# Patient Record
Sex: Female | Born: 1950 | Race: White | Hispanic: No | Smoking: Never smoker
Health system: Southern US, Community
[De-identification: ages and names within clinical notes are randomized; demographics above are authoritative.]

## PROBLEM LIST (undated history)

## (undated) DIAGNOSIS — K259 Gastric ulcer, unspecified as acute or chronic, without hemorrhage or perforation: Secondary | ICD-10-CM

## (undated) DIAGNOSIS — C801 Malignant (primary) neoplasm, unspecified: Secondary | ICD-10-CM

## (undated) HISTORY — DX: Gastric ulcer, unspecified as acute or chronic, without hemorrhage or perforation: K25.9

## (undated) HISTORY — DX: Malignant (primary) neoplasm, unspecified: C80.1

---

## 1979-11-19 HISTORY — PX: ABDOMINAL HYSTERECTOMY: SHX81

## 2005-12-01 ENCOUNTER — Emergency Department: Payer: Self-pay | Admitting: Emergency Medicine

## 2008-11-18 DIAGNOSIS — C801 Malignant (primary) neoplasm, unspecified: Secondary | ICD-10-CM

## 2008-11-18 HISTORY — PX: LAPAROSCOPY: SHX197

## 2008-11-18 HISTORY — PX: PORT A CATH REVISION: SHX6033

## 2008-11-18 HISTORY — DX: Malignant (primary) neoplasm, unspecified: C80.1

## 2008-11-18 HISTORY — PX: UPPER GI ENDOSCOPY: SHX6162

## 2008-11-18 HISTORY — PX: OVARY SURGERY: SHX727

## 2008-11-18 HISTORY — PX: COLONOSCOPY: SHX174

## 2008-12-04 ENCOUNTER — Inpatient Hospital Stay: Payer: Self-pay | Admitting: Internal Medicine

## 2008-12-19 ENCOUNTER — Ambulatory Visit: Payer: Self-pay | Admitting: Gynecologic Oncology

## 2008-12-19 ENCOUNTER — Ambulatory Visit: Payer: Self-pay | Admitting: Oncology

## 2008-12-25 ENCOUNTER — Inpatient Hospital Stay: Payer: Self-pay | Admitting: Oncology

## 2009-01-03 ENCOUNTER — Ambulatory Visit: Payer: Self-pay | Admitting: Gynecologic Oncology

## 2009-01-16 ENCOUNTER — Ambulatory Visit: Payer: Self-pay | Admitting: Gynecologic Oncology

## 2009-02-06 ENCOUNTER — Ambulatory Visit: Payer: Self-pay | Admitting: General Surgery

## 2009-02-16 ENCOUNTER — Ambulatory Visit: Payer: Self-pay | Admitting: Gynecologic Oncology

## 2009-03-09 ENCOUNTER — Ambulatory Visit: Payer: Self-pay | Admitting: Gynecologic Oncology

## 2009-03-14 ENCOUNTER — Inpatient Hospital Stay: Payer: Self-pay | Admitting: Oncology

## 2009-03-18 ENCOUNTER — Ambulatory Visit: Payer: Self-pay | Admitting: Gynecologic Oncology

## 2009-03-20 ENCOUNTER — Ambulatory Visit: Payer: Self-pay | Admitting: Gynecologic Oncology

## 2009-04-18 ENCOUNTER — Ambulatory Visit: Payer: Self-pay | Admitting: Gynecologic Oncology

## 2009-05-18 ENCOUNTER — Ambulatory Visit: Payer: Self-pay | Admitting: Gynecologic Oncology

## 2009-06-11 ENCOUNTER — Emergency Department: Payer: Self-pay | Admitting: Emergency Medicine

## 2009-06-18 ENCOUNTER — Ambulatory Visit: Payer: Self-pay | Admitting: Gynecologic Oncology

## 2009-07-19 ENCOUNTER — Ambulatory Visit: Payer: Self-pay | Admitting: Gynecologic Oncology

## 2009-07-28 ENCOUNTER — Ambulatory Visit: Payer: Self-pay | Admitting: Gynecologic Oncology

## 2009-08-15 ENCOUNTER — Inpatient Hospital Stay: Payer: Self-pay | Admitting: Internal Medicine

## 2009-08-18 ENCOUNTER — Ambulatory Visit: Payer: Self-pay | Admitting: Gynecologic Oncology

## 2009-09-18 ENCOUNTER — Ambulatory Visit: Payer: Self-pay | Admitting: Gynecologic Oncology

## 2009-10-18 ENCOUNTER — Ambulatory Visit: Payer: Self-pay | Admitting: Gynecologic Oncology

## 2009-11-18 ENCOUNTER — Ambulatory Visit: Payer: Self-pay | Admitting: Gynecologic Oncology

## 2009-12-19 ENCOUNTER — Ambulatory Visit: Payer: Self-pay | Admitting: Gynecologic Oncology

## 2010-01-16 ENCOUNTER — Ambulatory Visit: Payer: Self-pay | Admitting: Gynecologic Oncology

## 2010-02-16 ENCOUNTER — Ambulatory Visit: Payer: Self-pay | Admitting: Gynecologic Oncology

## 2010-03-18 ENCOUNTER — Ambulatory Visit: Payer: Self-pay | Admitting: Gynecologic Oncology

## 2010-04-18 ENCOUNTER — Ambulatory Visit: Payer: Self-pay | Admitting: Gynecologic Oncology

## 2010-05-18 ENCOUNTER — Ambulatory Visit: Payer: Self-pay | Admitting: Gynecologic Oncology

## 2010-06-18 ENCOUNTER — Ambulatory Visit: Payer: Self-pay | Admitting: Gynecologic Oncology

## 2010-06-28 ENCOUNTER — Inpatient Hospital Stay: Payer: Self-pay | Admitting: Oncology

## 2010-07-19 ENCOUNTER — Ambulatory Visit: Payer: Self-pay | Admitting: Gynecologic Oncology

## 2010-08-18 ENCOUNTER — Ambulatory Visit: Payer: Self-pay | Admitting: Gynecologic Oncology

## 2010-08-29 LAB — CA 125: CA 125: 97.3 U/mL — ABNORMAL HIGH (ref 0.0–34.0)

## 2010-09-18 ENCOUNTER — Ambulatory Visit: Payer: Self-pay | Admitting: Gynecologic Oncology

## 2010-10-18 ENCOUNTER — Ambulatory Visit: Payer: Self-pay | Admitting: Gynecologic Oncology

## 2010-10-21 LAB — CA 125: CA 125: 303.1 U/mL — ABNORMAL HIGH (ref 0.0–34.0)

## 2010-10-24 ENCOUNTER — Inpatient Hospital Stay: Payer: Self-pay | Admitting: Internal Medicine

## 2010-10-28 ENCOUNTER — Emergency Department: Payer: Self-pay | Admitting: Emergency Medicine

## 2010-11-15 LAB — CA 125: CA 125: 172.1 U/mL — ABNORMAL HIGH (ref 0.0–34.0)

## 2010-11-18 ENCOUNTER — Ambulatory Visit: Payer: Self-pay | Admitting: Gynecologic Oncology

## 2010-12-05 LAB — CA 125: CA 125: 72.6 U/mL — ABNORMAL HIGH (ref 0.0–34.0)

## 2010-12-18 LAB — CA 125: CA 125: 45.9 U/mL — ABNORMAL HIGH (ref 0.0–34.0)

## 2010-12-19 ENCOUNTER — Ambulatory Visit: Payer: Self-pay | Admitting: Gynecologic Oncology

## 2011-01-17 ENCOUNTER — Ambulatory Visit: Payer: Self-pay | Admitting: Gynecologic Oncology

## 2011-02-02 ENCOUNTER — Emergency Department: Payer: Self-pay | Admitting: Unknown Physician Specialty

## 2011-02-17 ENCOUNTER — Ambulatory Visit: Payer: Self-pay | Admitting: Gynecologic Oncology

## 2011-02-19 LAB — CA 125: CA 125: 52.8 U/mL — ABNORMAL HIGH (ref 0.0–34.0)

## 2011-03-19 ENCOUNTER — Ambulatory Visit: Payer: Self-pay | Admitting: Gynecologic Oncology

## 2011-04-19 ENCOUNTER — Ambulatory Visit: Payer: Self-pay | Admitting: Gynecologic Oncology

## 2011-05-19 ENCOUNTER — Ambulatory Visit: Payer: Self-pay | Admitting: Gynecologic Oncology

## 2011-06-19 ENCOUNTER — Ambulatory Visit: Payer: Self-pay | Admitting: Gynecologic Oncology

## 2011-07-12 LAB — CA 125: CA 125: 58 U/mL — ABNORMAL HIGH (ref 0.0–34.0)

## 2011-07-20 ENCOUNTER — Ambulatory Visit: Payer: Self-pay | Admitting: Gynecologic Oncology

## 2011-08-19 ENCOUNTER — Ambulatory Visit: Payer: Self-pay | Admitting: Gynecologic Oncology

## 2011-09-03 LAB — CA 125: CA 125: 65.5 U/mL — ABNORMAL HIGH (ref 0.0–34.0)

## 2011-09-19 ENCOUNTER — Ambulatory Visit: Payer: Self-pay | Admitting: Gynecologic Oncology

## 2011-10-19 ENCOUNTER — Ambulatory Visit: Payer: Self-pay | Admitting: Gynecologic Oncology

## 2011-11-19 ENCOUNTER — Ambulatory Visit: Payer: Self-pay | Admitting: Gynecologic Oncology

## 2011-11-19 HISTORY — PX: ABDOMINAL SURGERY: SHX537

## 2011-11-19 LAB — CA 125: CA 125: 58.1 U/mL — ABNORMAL HIGH (ref 0.0–34.0)

## 2011-11-25 LAB — CBC CANCER CENTER
Eosinophil: 1 %
HCT: 28.9 % — ABNORMAL LOW (ref 35.0–47.0)
HGB: 9.1 g/dL — ABNORMAL LOW (ref 12.0–16.0)
Lymphocytes: 7 %
MCH: 24.2 pg — ABNORMAL LOW (ref 26.0–34.0)
MCV: 77 fL — ABNORMAL LOW (ref 80–100)
Monocytes: 2 %
Platelet: 253 x10 3/mm (ref 150–440)
RBC: 3.76 10*6/uL — ABNORMAL LOW (ref 3.80–5.20)
WBC: 27.2 x10 3/mm — ABNORMAL HIGH (ref 3.6–11.0)

## 2011-12-02 LAB — CBC CANCER CENTER
Eosinophil: 1 %
HCT: 28.8 % — ABNORMAL LOW (ref 35.0–47.0)
HGB: 9.1 g/dL — ABNORMAL LOW (ref 12.0–16.0)
Lymphocytes: 6 %
MCV: 76 fL — ABNORMAL LOW (ref 80–100)
Platelet: 184 x10 3/mm (ref 150–440)
RBC: 3.79 10*6/uL — ABNORMAL LOW (ref 3.80–5.20)
RDW: 22.6 % — ABNORMAL HIGH (ref 11.5–14.5)
WBC: 9.9 x10 3/mm (ref 3.6–11.0)

## 2011-12-04 ENCOUNTER — Ambulatory Visit: Payer: Self-pay | Admitting: General Surgery

## 2011-12-09 LAB — COMPREHENSIVE METABOLIC PANEL
Albumin: 3.5 g/dL (ref 3.4–5.0)
Alkaline Phosphatase: 103 U/L (ref 50–136)
Anion Gap: 6 — ABNORMAL LOW (ref 7–16)
Calcium, Total: 8.7 mg/dL (ref 8.5–10.1)
Co2: 33 mmol/L — ABNORMAL HIGH (ref 21–32)
Creatinine: 1.04 mg/dL (ref 0.60–1.30)
EGFR (African American): >60
Glucose: 105 mg/dL — ABNORMAL HIGH (ref 65–99)
SGOT(AST): 12 U/L — ABNORMAL LOW (ref 15–37)
SGPT (ALT): 20 U/L
Sodium: 138 mmol/L (ref 136–145)
Total Protein: 7.2 g/dL (ref 6.4–8.2)

## 2011-12-09 LAB — CBC CANCER CENTER
Basophil #: 0 x10 3/mm (ref 0.0–0.1)
Basophil %: 0 %
Eosinophil %: 3.4 %
Eosinophil: 1 %
HCT: 29.1 % — ABNORMAL LOW (ref 35.0–47.0)
HGB: 9.1 g/dL — ABNORMAL LOW (ref 12.0–16.0)
Lymphocyte #: 0.9 x10 3/mm — ABNORMAL LOW (ref 1.0–3.6)
MCH: 23.7 pg — ABNORMAL LOW (ref 26.0–34.0)
MCV: 76 fL — ABNORMAL LOW (ref 80–100)
Monocyte %: 5.4 %
Monocytes: 6 %
Neutrophil #: 3.2 x10 3/mm (ref 1.4–6.5)
RDW: 22.7 % — ABNORMAL HIGH (ref 11.5–14.5)
WBC: 4.6 x10 3/mm (ref 3.6–11.0)

## 2011-12-11 ENCOUNTER — Ambulatory Visit: Payer: Self-pay | Admitting: General Surgery

## 2011-12-13 LAB — PATHOLOGY REPORT

## 2011-12-20 ENCOUNTER — Ambulatory Visit: Payer: Self-pay | Admitting: Gynecologic Oncology

## 2011-12-23 LAB — CBC CANCER CENTER
Basophil #: 0 x10 3/mm (ref 0.0–0.1)
Basophil %: 0 %
Eosinophil #: 0.1 x10 3/mm (ref 0.0–0.7)
HCT: 26.8 % — ABNORMAL LOW (ref 35.0–47.0)
HGB: 8.3 g/dL — ABNORMAL LOW (ref 12.0–16.0)
Lymphocyte %: 16.7 %
MCH: 23.3 pg — ABNORMAL LOW (ref 26.0–34.0)
MCHC: 31.1 g/dL — ABNORMAL LOW (ref 32.0–36.0)
Monocyte #: 0.3 x10 3/mm (ref 0.0–0.7)
Neutrophil #: 3 x10 3/mm (ref 1.4–6.5)
Neutrophil %: 73.2 %
Platelet: 274 x10 3/mm (ref 150–440)
RDW: 20.9 % — ABNORMAL HIGH (ref 11.5–14.5)
Segmented Neutrophils: 74 %
WBC: 4.1 x10 3/mm (ref 3.6–11.0)

## 2011-12-23 LAB — COMPREHENSIVE METABOLIC PANEL
Alkaline Phosphatase: 71 U/L (ref 50–136)
BUN: 17 mg/dL (ref 7–18)
Bilirubin,Total: 0.1 mg/dL — ABNORMAL LOW (ref 0.2–1.0)
Chloride: 100 mmol/L (ref 98–107)
Creatinine: 1.11 mg/dL (ref 0.60–1.30)
EGFR (African American): >60
SGOT(AST): 19 U/L (ref 15–37)
SGPT (ALT): 23 U/L
Total Protein: 6.6 g/dL (ref 6.4–8.2)

## 2011-12-30 LAB — CBC CANCER CENTER
Bands: 6 %
Eosinophil: 2 %
Lymphocytes: 19 %
MCHC: 31.2 g/dL — ABNORMAL LOW (ref 32.0–36.0)
MCV: 75 fL — ABNORMAL LOW (ref 80–100)
Monocytes: 7 %
Platelet: 139 x10 3/mm — ABNORMAL LOW (ref 150–440)
RBC: 3.61 10*6/uL — ABNORMAL LOW (ref 3.80–5.20)
Segmented Neutrophils: 66 %
WBC: 10.1 x10 3/mm (ref 3.6–11.0)

## 2012-01-06 LAB — CBC CANCER CENTER
Bands: 15 %
Eosinophil: 1 %
HCT: 29.5 % — ABNORMAL LOW (ref 35.0–47.0)
Lymphocytes: 8 %
MCH: 23.1 pg — ABNORMAL LOW (ref 26.0–34.0)
MCHC: 31 g/dL — ABNORMAL LOW (ref 32.0–36.0)
MCV: 74 fL — ABNORMAL LOW (ref 80–100)
Monocytes: 3 %
Platelet: 229 x10 3/mm (ref 150–440)
RBC: 3.97 10*6/uL (ref 3.80–5.20)
Segmented Neutrophils: 73 %

## 2012-01-13 LAB — CBC CANCER CENTER
Basophil #: 0 x10 3/mm (ref 0.0–0.1)
Basophil %: 0 %
Eosinophil: 4 %
HCT: 28.4 % — ABNORMAL LOW (ref 35.0–47.0)
HGB: 8.7 g/dL — ABNORMAL LOW (ref 12.0–16.0)
Lymphocyte #: 0.7 x10 3/mm — ABNORMAL LOW (ref 1.0–3.6)
MCH: 22.6 pg — ABNORMAL LOW (ref 26.0–34.0)
MCHC: 30.6 g/dL — ABNORMAL LOW (ref 32.0–36.0)
MCV: 74 fL — ABNORMAL LOW (ref 80–100)
Monocyte #: 0.2 x10 3/mm (ref 0.0–0.7)
Monocyte %: 7.9 %
Monocytes: 9 %
Neutrophil #: 1.6 x10 3/mm (ref 1.4–6.5)
Platelet: 307 x10 3/mm (ref 150–440)
RDW: 20.8 % — ABNORMAL HIGH (ref 11.5–14.5)

## 2012-01-14 LAB — CA 125: CA 125: 65.8 U/mL — ABNORMAL HIGH (ref 0.0–34.0)

## 2012-01-17 ENCOUNTER — Ambulatory Visit: Payer: Self-pay | Admitting: Gynecologic Oncology

## 2012-01-20 LAB — COMPREHENSIVE METABOLIC PANEL
Alkaline Phosphatase: 65 U/L (ref 50–136)
Anion Gap: 8 (ref 7–16)
BUN: 15 mg/dL (ref 7–18)
Calcium, Total: 8.4 mg/dL — ABNORMAL LOW (ref 8.5–10.1)
Chloride: 102 mmol/L (ref 98–107)
Co2: 28 mmol/L (ref 21–32)
EGFR (African American): >60
EGFR (Non-African Amer.): >60
Osmolality: 275 (ref 275–301)
Potassium: 4.4 mmol/L (ref 3.5–5.1)
Sodium: 138 mmol/L (ref 136–145)

## 2012-01-20 LAB — CBC CANCER CENTER
Comment - H1-Com3: NORMAL
HCT: 26.4 % — ABNORMAL LOW (ref 35.0–47.0)
MCH: 22.8 pg — ABNORMAL LOW (ref 26.0–34.0)
MCHC: 31.1 g/dL — ABNORMAL LOW (ref 32.0–36.0)
MCV: 74 fL — ABNORMAL LOW (ref 80–100)
Monocytes: 8 %
RBC: 3.59 10*6/uL — ABNORMAL LOW (ref 3.80–5.20)
RDW: 21.7 % — ABNORMAL HIGH (ref 11.5–14.5)
Segmented Neutrophils: 49 %
WBC: 2.3 x10 3/mm — ABNORMAL LOW (ref 3.6–11.0)

## 2012-01-27 LAB — CBC CANCER CENTER
Basophil %: 0 %
Eosinophil %: 1.3 %
HCT: 29.1 % — ABNORMAL LOW (ref 35.0–47.0)
HGB: 9.1 g/dL — ABNORMAL LOW (ref 12.0–16.0)
Lymphocyte #: 1.9 x10 3/mm (ref 1.0–3.6)
Lymphocyte %: 8.2 %
MCH: 22.8 pg — ABNORMAL LOW (ref 26.0–34.0)
MCHC: 31.1 g/dL — ABNORMAL LOW (ref 32.0–36.0)
MCV: 73 fL — ABNORMAL LOW (ref 80–100)
Monocyte %: 2.7 %
Neutrophil %: 87.8 %
RBC: 3.97 10*6/uL (ref 3.80–5.20)
RDW: 20.9 % — ABNORMAL HIGH (ref 11.5–14.5)

## 2012-02-03 LAB — CBC CANCER CENTER
Bands: 7 %
Basophil #: 0 x10 3/mm (ref 0.0–0.1)
Basophil %: 0 %
Eosinophil #: 0.1 x10 3/mm (ref 0.0–0.7)
HCT: 27.9 % — ABNORMAL LOW (ref 35.0–47.0)
HGB: 8.8 g/dL — ABNORMAL LOW (ref 12.0–16.0)
Lymphocyte #: 0.6 x10 3/mm — ABNORMAL LOW (ref 1.0–3.6)
MCH: 22.8 pg — ABNORMAL LOW (ref 26.0–34.0)
MCHC: 31.4 g/dL — ABNORMAL LOW (ref 32.0–36.0)
Monocyte #: 0.1 x10 3/mm (ref 0.0–0.7)
Monocytes: 3 %
Neutrophil #: 2.9 x10 3/mm (ref 1.4–6.5)
Neutrophil %: 77.8 %
Platelet: 116 x10 3/mm — ABNORMAL LOW (ref 150–440)
RDW: 21.5 % — ABNORMAL HIGH (ref 11.5–14.5)

## 2012-02-03 LAB — TSH: Thyroid Stimulating Horm: 6.78 u[IU]/mL — ABNORMAL HIGH

## 2012-02-10 LAB — CBC CANCER CENTER
Basophil #: 0 x10 3/mm (ref 0.0–0.1)
Basophil: 1 %
Eosinophil #: 0.1 x10 3/mm (ref 0.0–0.7)
HCT: 29.8 % — ABNORMAL LOW (ref 35.0–47.0)
Lymphocyte %: 25.9 %
MCH: 22.2 pg — ABNORMAL LOW (ref 26.0–34.0)
MCHC: 30.2 g/dL — ABNORMAL LOW (ref 32.0–36.0)
MCV: 73 fL — ABNORMAL LOW (ref 80–100)
Monocyte %: 11.4 %
RDW: 21.4 % — ABNORMAL HIGH (ref 11.5–14.5)
Segmented Neutrophils: 56 %

## 2012-02-17 ENCOUNTER — Ambulatory Visit: Payer: Self-pay | Admitting: Gynecologic Oncology

## 2012-02-17 LAB — COMPREHENSIVE METABOLIC PANEL
Albumin: 3.1 g/dL — ABNORMAL LOW (ref 3.4–5.0)
Alkaline Phosphatase: 60 U/L (ref 50–136)
Anion Gap: 7 (ref 7–16)
BUN: 17 mg/dL (ref 7–18)
Calcium, Total: 7.9 mg/dL — ABNORMAL LOW (ref 8.5–10.1)
Chloride: 101 mmol/L (ref 98–107)
Co2: 29 mmol/L (ref 21–32)
Creatinine: 1.06 mg/dL (ref 0.60–1.30)
EGFR (African American): >60
EGFR (Non-African Amer.): 56 — ABNORMAL LOW
Glucose: 93 mg/dL (ref 65–99)
Potassium: 4 mmol/L (ref 3.5–5.1)
SGOT(AST): 17 U/L (ref 15–37)
SGPT (ALT): 13 U/L
Sodium: 137 mmol/L (ref 136–145)
Total Protein: 6.3 g/dL — ABNORMAL LOW (ref 6.4–8.2)

## 2012-02-17 LAB — CBC CANCER CENTER
Basophil #: 0 x10 3/mm (ref 0.0–0.1)
Basophil %: 0 %
Eosinophil %: 6.1 %
HCT: 26.4 % — ABNORMAL LOW (ref 35.0–47.0)
HGB: 7.9 g/dL — ABNORMAL LOW (ref 12.0–16.0)
Lymphocyte #: 0.6 x10 3/mm — ABNORMAL LOW (ref 1.0–3.6)
Lymphocyte %: 24.4 %
MCH: 22.1 pg — ABNORMAL LOW (ref 26.0–34.0)
MCHC: 30 g/dL — ABNORMAL LOW (ref 32.0–36.0)
Platelet: 215 x10 3/mm (ref 150–440)
RBC: 3.59 10*6/uL — ABNORMAL LOW (ref 3.80–5.20)
RDW: 21 % — ABNORMAL HIGH (ref 11.5–14.5)

## 2012-02-18 LAB — CA 125: CA 125: 45.1 U/mL — ABNORMAL HIGH (ref 0.0–34.0)

## 2012-02-24 LAB — COMPREHENSIVE METABOLIC PANEL
Albumin: 3.5 g/dL (ref 3.4–5.0)
Anion Gap: 7 (ref 7–16)
BUN: 19 mg/dL — ABNORMAL HIGH (ref 7–18)
Calcium, Total: 8 mg/dL — ABNORMAL LOW (ref 8.5–10.1)
Chloride: 102 mmol/L (ref 98–107)
Co2: 27 mmol/L (ref 21–32)
EGFR (African American): >60
EGFR (Non-African Amer.): 59 — ABNORMAL LOW
Glucose: 78 mg/dL (ref 65–99)
Osmolality: 273 (ref 275–301)
SGPT (ALT): 14 U/L
Sodium: 136 mmol/L (ref 136–145)
Total Protein: 7.2 g/dL (ref 6.4–8.2)

## 2012-02-24 LAB — CBC CANCER CENTER
Bands: 4 %
Basophil #: 0 x10 3/mm (ref 0.0–0.1)
Basophil %: 0 %
Eosinophil: 3 %
HCT: 28.9 % — ABNORMAL LOW (ref 35.0–47.0)
HGB: 8.9 g/dL — ABNORMAL LOW (ref 12.0–16.0)
Lymphocyte %: 18.2 %
Lymphocytes: 16 %
MCH: 22.4 pg — ABNORMAL LOW (ref 26.0–34.0)
MCHC: 30.9 g/dL — ABNORMAL LOW (ref 32.0–36.0)
MCV: 73 fL — ABNORMAL LOW (ref 80–100)
Monocyte #: 0.3 x10 3/mm (ref 0.0–0.7)
Monocyte %: 7.4 %
Monocytes: 4 %
Neutrophil #: 2.4 x10 3/mm (ref 1.4–6.5)
Neutrophil %: 70.1 %
RBC: 3.98 10*6/uL (ref 3.80–5.20)
Segmented Neutrophils: 72 %
WBC: 3.5 x10 3/mm — ABNORMAL LOW (ref 3.6–11.0)

## 2012-03-02 LAB — CBC CANCER CENTER
Basophil #: 0.1 x10 3/mm (ref 0.0–0.1)
Basophil %: 0.5 %
Eosinophil #: 0.2 x10 3/mm (ref 0.0–0.7)
Eosinophil %: 1.2 %
HCT: 28.4 % — ABNORMAL LOW (ref 35.0–47.0)
Lymphocyte %: 12.5 %
MCH: 21.6 pg — ABNORMAL LOW (ref 26.0–34.0)
MCHC: 29.1 g/dL — ABNORMAL LOW (ref 32.0–36.0)
MCV: 74 fL — ABNORMAL LOW (ref 80–100)
Monocyte %: 4.3 %
Neutrophil %: 81.5 %
Platelet: 173 x10 3/mm (ref 150–440)
RBC: 3.82 10*6/uL (ref 3.80–5.20)
WBC: 17.9 x10 3/mm — ABNORMAL HIGH (ref 3.6–11.0)

## 2012-03-09 LAB — CBC CANCER CENTER
Eosinophil #: 0 x10 3/mm (ref 0.0–0.7)
Lymphocyte #: 0.9 x10 3/mm — ABNORMAL LOW (ref 1.0–3.6)
MCH: 23 pg — ABNORMAL LOW (ref 26.0–34.0)
MCHC: 29.7 g/dL — ABNORMAL LOW (ref 32.0–36.0)
MCV: 77 fL — ABNORMAL LOW (ref 80–100)
Monocyte %: 2.8 %
Neutrophil #: 11.5 x10 3/mm — ABNORMAL HIGH (ref 1.4–6.5)
Neutrophil %: 89.7 %
Platelet: 189 x10 3/mm (ref 150–440)
RDW: 24.1 % — ABNORMAL HIGH (ref 11.5–14.5)

## 2012-03-10 ENCOUNTER — Ambulatory Visit: Payer: Self-pay | Admitting: Specialist

## 2012-03-16 LAB — CBC CANCER CENTER
Basophil #: 0.1 x10 3/mm (ref 0.0–0.1)
Eosinophil #: 0.1 x10 3/mm (ref 0.0–0.7)
MCHC: 30.4 g/dL — ABNORMAL LOW (ref 32.0–36.0)
MCV: 78 fL — ABNORMAL LOW (ref 80–100)
Monocyte #: 0.3 x10 3/mm (ref 0.2–0.9)
Neutrophil %: 66.7 %
Platelet: 342 x10 3/mm (ref 150–440)
RDW: 24.4 % — ABNORMAL HIGH (ref 11.5–14.5)
WBC: 4.5 x10 3/mm (ref 3.6–11.0)

## 2012-03-18 ENCOUNTER — Ambulatory Visit: Payer: Self-pay | Admitting: Gynecologic Oncology

## 2012-03-23 LAB — COMPREHENSIVE METABOLIC PANEL
Albumin: 3.2 g/dL — ABNORMAL LOW (ref 3.4–5.0)
Alkaline Phosphatase: 76 U/L (ref 50–136)
Bilirubin,Total: 0.2 mg/dL (ref 0.2–1.0)
Chloride: 100 mmol/L (ref 98–107)
EGFR (African American): >60
EGFR (Non-African Amer.): >60
Osmolality: 271 (ref 275–301)
Potassium: 4.4 mmol/L (ref 3.5–5.1)
Total Protein: 6.7 g/dL (ref 6.4–8.2)

## 2012-03-23 LAB — CBC CANCER CENTER
Basophil #: 0 x10 3/mm (ref 0.0–0.1)
Basophil %: 1.6 %
Eosinophil %: 3.9 %
HCT: 30.7 % — ABNORMAL LOW (ref 35.0–47.0)
Lymphocyte #: 0.7 x10 3/mm — ABNORMAL LOW (ref 1.0–3.6)
Lymphocyte %: 33.7 %
MCHC: 29.8 g/dL — ABNORMAL LOW (ref 32.0–36.0)
MCV: 79 fL — ABNORMAL LOW (ref 80–100)
Monocyte %: 11.1 %
Neutrophil #: 1 x10 3/mm — ABNORMAL LOW (ref 1.4–6.5)
Neutrophil %: 49.7 %
RBC: 3.89 10*6/uL (ref 3.80–5.20)
RDW: 25.9 % — ABNORMAL HIGH (ref 11.5–14.5)
WBC: 2.1 x10 3/mm — ABNORMAL LOW (ref 3.6–11.0)

## 2012-03-24 LAB — CA 125: CA 125: 7.6 U/mL (ref 0.0–34.0)

## 2012-03-30 LAB — CBC CANCER CENTER
Eosinophil #: 0 x10 3/mm (ref 0.0–0.7)
Eosinophil %: 0.5 %
HCT: 31.8 % — ABNORMAL LOW (ref 35.0–47.0)
HGB: 9.6 g/dL — ABNORMAL LOW (ref 12.0–16.0)
Lymphocyte #: 0.5 x10 3/mm — ABNORMAL LOW (ref 1.0–3.6)
MCH: 23.8 pg — ABNORMAL LOW (ref 26.0–34.0)
MCV: 79 fL — ABNORMAL LOW (ref 80–100)
Monocyte #: 0.5 x10 3/mm (ref 0.2–0.9)
Monocyte %: 5.7 %
Neutrophil #: 7.3 x10 3/mm — ABNORMAL HIGH (ref 1.4–6.5)
RDW: 24.4 % — ABNORMAL HIGH (ref 11.5–14.5)
WBC: 8.3 x10 3/mm (ref 3.6–11.0)

## 2012-03-30 LAB — COMPREHENSIVE METABOLIC PANEL
Albumin: 3.3 g/dL — ABNORMAL LOW (ref 3.4–5.0)
Alkaline Phosphatase: 72 U/L (ref 50–136)
Anion Gap: 9 (ref 7–16)
BUN: 14 mg/dL (ref 7–18)
Bilirubin,Total: 0.3 mg/dL (ref 0.2–1.0)
Calcium, Total: 8.1 mg/dL — ABNORMAL LOW (ref 8.5–10.1)
Chloride: 103 mmol/L (ref 98–107)
EGFR (African American): >60
EGFR (Non-African Amer.): >60
Osmolality: 272 (ref 275–301)
Potassium: 4.6 mmol/L (ref 3.5–5.1)
SGPT (ALT): 15 U/L
Total Protein: 7 g/dL (ref 6.4–8.2)

## 2012-04-06 LAB — BASIC METABOLIC PANEL
Anion Gap: 6 — ABNORMAL LOW (ref 7–16)
BUN: 13 mg/dL (ref 7–18)
Calcium, Total: 8.5 mg/dL (ref 8.5–10.1)
Co2: 32 mmol/L (ref 21–32)
Creatinine: 1.14 mg/dL (ref 0.60–1.30)
Osmolality: 274 (ref 275–301)
Sodium: 137 mmol/L (ref 136–145)

## 2012-04-06 LAB — CBC CANCER CENTER
Basophil #: 0 x10 3/mm (ref 0.0–0.1)
Basophil %: 0.8 %
Eosinophil #: 0.1 x10 3/mm (ref 0.0–0.7)
HCT: 31.2 % — ABNORMAL LOW (ref 35.0–47.0)
HGB: 9.4 g/dL — ABNORMAL LOW (ref 12.0–16.0)
Lymphocyte #: 0.7 x10 3/mm — ABNORMAL LOW (ref 1.0–3.6)
Lymphocyte %: 19.6 %
MCH: 23.8 pg — ABNORMAL LOW (ref 26.0–34.0)
MCHC: 30.2 g/dL — ABNORMAL LOW (ref 32.0–36.0)
MCV: 79 fL — ABNORMAL LOW (ref 80–100)
WBC: 3.5 x10 3/mm — ABNORMAL LOW (ref 3.6–11.0)

## 2012-04-14 LAB — CBC CANCER CENTER
Eosinophil #: 0.2 x10 3/mm (ref 0.0–0.7)
HCT: 31.8 % — ABNORMAL LOW (ref 35.0–47.0)
HGB: 9.5 g/dL — ABNORMAL LOW (ref 12.0–16.0)
Lymphocyte #: 1.7 x10 3/mm (ref 1.0–3.6)
Lymphocyte %: 13.3 %
MCH: 23.8 pg — ABNORMAL LOW (ref 26.0–34.0)
MCV: 80 fL (ref 80–100)
Monocyte #: 0.7 x10 3/mm (ref 0.2–0.9)
Monocyte %: 5.2 %
Neutrophil #: 10 x10 3/mm — ABNORMAL HIGH (ref 1.4–6.5)
Neutrophil %: 79.4 %
Platelet: 143 x10 3/mm — ABNORMAL LOW (ref 150–440)
RBC: 3.99 10*6/uL (ref 3.80–5.20)

## 2012-04-18 ENCOUNTER — Ambulatory Visit: Payer: Self-pay | Admitting: Gynecologic Oncology

## 2012-04-20 LAB — CBC CANCER CENTER
Eosinophil #: 0.1 x10 3/mm (ref 0.0–0.7)
Eosinophil %: 0.7 %
HCT: 31.4 % — ABNORMAL LOW (ref 35.0–47.0)
Lymphocyte #: 0.7 x10 3/mm — ABNORMAL LOW (ref 1.0–3.6)
Lymphocyte %: 9.9 %
MCH: 23.9 pg — ABNORMAL LOW (ref 26.0–34.0)
MCHC: 29.9 g/dL — ABNORMAL LOW (ref 32.0–36.0)
MCV: 80 fL (ref 80–100)
Neutrophil #: 6.2 x10 3/mm (ref 1.4–6.5)
Neutrophil %: 83.1 %
Platelet: 175 x10 3/mm (ref 150–440)
RBC: 3.94 10*6/uL (ref 3.80–5.20)

## 2012-04-27 LAB — COMPREHENSIVE METABOLIC PANEL
Alkaline Phosphatase: 100 U/L (ref 50–136)
BUN: 14 mg/dL (ref 7–18)
Bilirubin,Total: 0.2 mg/dL (ref 0.2–1.0)
Chloride: 98 mmol/L (ref 98–107)
Creatinine: 0.87 mg/dL (ref 0.60–1.30)
EGFR (African American): >60
EGFR (Non-African Amer.): >60
SGOT(AST): 15 U/L (ref 15–37)
SGPT (ALT): 17 U/L
Sodium: 140 mmol/L (ref 136–145)

## 2012-04-27 LAB — CBC CANCER CENTER
Basophil #: 0 x10 3/mm (ref 0.0–0.1)
Basophil %: 1.6 %
HCT: 28.9 % — ABNORMAL LOW (ref 35.0–47.0)
HGB: 8.7 g/dL — ABNORMAL LOW (ref 12.0–16.0)
Lymphocyte #: 0.6 x10 3/mm — ABNORMAL LOW (ref 1.0–3.6)
MCHC: 30.1 g/dL — ABNORMAL LOW (ref 32.0–36.0)
MCV: 80 fL (ref 80–100)
Monocyte %: 8.5 %
Neutrophil #: 1.6 x10 3/mm (ref 1.4–6.5)
Neutrophil %: 63.8 %
RBC: 3.63 10*6/uL — ABNORMAL LOW (ref 3.80–5.20)
RDW: 22.9 % — ABNORMAL HIGH (ref 11.5–14.5)
WBC: 2.5 x10 3/mm — ABNORMAL LOW (ref 3.6–11.0)

## 2012-04-28 LAB — CA 125: CA 125: 59 U/mL — ABNORMAL HIGH (ref 0.0–34.0)

## 2012-05-04 LAB — CBC CANCER CENTER
Basophil #: 0.1 x10 3/mm (ref 0.0–0.1)
Basophil %: 0.6 %
Eosinophil %: 1.1 %
HCT: 32.3 % — ABNORMAL LOW (ref 35.0–47.0)
HGB: 9.7 g/dL — ABNORMAL LOW (ref 12.0–16.0)
Lymphocyte #: 1.8 x10 3/mm (ref 1.0–3.6)
MCH: 24.6 pg — ABNORMAL LOW (ref 26.0–34.0)
MCHC: 30.1 g/dL — ABNORMAL LOW (ref 32.0–36.0)
Monocyte #: 1.4 x10 3/mm — ABNORMAL HIGH (ref 0.2–0.9)
Monocyte %: 6.7 %
Neutrophil %: 82.7 %
Platelet: 299 x10 3/mm (ref 150–440)
RDW: 22.5 % — ABNORMAL HIGH (ref 11.5–14.5)

## 2012-05-11 LAB — CBC CANCER CENTER
Eosinophil %: 1.3 %
HCT: 32.9 % — ABNORMAL LOW (ref 35.0–47.0)
Lymphocyte #: 0.8 x10 3/mm — ABNORMAL LOW (ref 1.0–3.6)
Lymphocyte %: 15.2 %
MCHC: 30.3 g/dL — ABNORMAL LOW (ref 32.0–36.0)
MCV: 81 fL (ref 80–100)
Monocyte #: 0.3 x10 3/mm (ref 0.2–0.9)
Monocyte %: 4.9 %
Neutrophil #: 4 x10 3/mm (ref 1.4–6.5)
RBC: 4.07 10*6/uL (ref 3.80–5.20)
RDW: 22.7 % — ABNORMAL HIGH (ref 11.5–14.5)

## 2012-05-12 ENCOUNTER — Ambulatory Visit: Payer: Self-pay | Admitting: Specialist

## 2012-05-12 LAB — BASIC METABOLIC PANEL
Co2: 29 mmol/L (ref 21–32)
Creatinine: 0.77 mg/dL (ref 0.60–1.30)
EGFR (African American): >60
EGFR (Non-African Amer.): >60
Glucose: 79 mg/dL (ref 65–99)
Osmolality: 268 (ref 275–301)
Potassium: 4.6 mmol/L (ref 3.5–5.1)
Sodium: 134 mmol/L — ABNORMAL LOW (ref 136–145)

## 2012-05-12 LAB — PROTIME-INR
INR: 0.9
Prothrombin Time: 12.9 secs (ref 11.5–14.7)

## 2012-05-12 LAB — URINALYSIS, COMPLETE
Bilirubin,UR: NEGATIVE
Glucose,UR: NEGATIVE mg/dL (ref 0–75)
Leukocyte Esterase: NEGATIVE
Nitrite: NEGATIVE
Ph: 7 (ref 4.5–8.0)
Specific Gravity: 1.025 (ref 1.003–1.030)
WBC UR: 1 /HPF (ref 0–5)

## 2012-05-18 ENCOUNTER — Ambulatory Visit: Payer: Self-pay | Admitting: Gynecologic Oncology

## 2012-05-18 LAB — CBC CANCER CENTER
Basophil #: 0 x10 3/mm (ref 0.0–0.1)
Basophil %: 1.6 %
Eosinophil #: 0.1 x10 3/mm (ref 0.0–0.7)
HGB: 9.4 g/dL — ABNORMAL LOW (ref 12.0–16.0)
Lymphocyte %: 25.5 %
MCHC: 30.4 g/dL — ABNORMAL LOW (ref 32.0–36.0)
Monocyte %: 8.1 %
Neutrophil %: 62.5 %
RBC: 3.81 10*6/uL (ref 3.80–5.20)
RDW: 22.3 % — ABNORMAL HIGH (ref 11.5–14.5)
WBC: 2.6 x10 3/mm — ABNORMAL LOW (ref 3.6–11.0)

## 2012-05-19 ENCOUNTER — Ambulatory Visit: Payer: Self-pay | Admitting: Specialist

## 2012-05-25 LAB — COMPREHENSIVE METABOLIC PANEL
Alkaline Phosphatase: 94 U/L (ref 50–136)
Anion Gap: 6 — ABNORMAL LOW (ref 7–16)
BUN: 18 mg/dL (ref 7–18)
Bilirubin,Total: 0.2 mg/dL (ref 0.2–1.0)
Chloride: 97 mmol/L — ABNORMAL LOW (ref 98–107)
Co2: 31 mmol/L (ref 21–32)
Creatinine: 1.02 mg/dL (ref 0.60–1.30)
EGFR (African American): >60
EGFR (Non-African Amer.): 59 — ABNORMAL LOW
SGPT (ALT): 13 U/L
Total Protein: 6.6 g/dL (ref 6.4–8.2)

## 2012-05-25 LAB — CBC CANCER CENTER
Basophil #: 0 x10 3/mm (ref 0.0–0.1)
Eosinophil #: 0.1 x10 3/mm (ref 0.0–0.7)
HCT: 28.7 % — ABNORMAL LOW (ref 35.0–47.0)
Lymphocyte %: 23.3 %
MCH: 25 pg — ABNORMAL LOW (ref 26.0–34.0)
Monocyte #: 0.2 x10 3/mm (ref 0.2–0.9)
Neutrophil #: 1.7 x10 3/mm (ref 1.4–6.5)
Neutrophil %: 62.8 %
Platelet: 284 x10 3/mm (ref 150–440)
RBC: 3.54 10*6/uL — ABNORMAL LOW (ref 3.80–5.20)
WBC: 2.8 x10 3/mm — ABNORMAL LOW (ref 3.6–11.0)

## 2012-05-26 LAB — CA 125: CA 125: 41.8 U/mL — ABNORMAL HIGH (ref 0.0–34.0)

## 2012-06-01 LAB — CBC CANCER CENTER
Basophil %: 0.5 %
Eosinophil #: 0.3 x10 3/mm (ref 0.0–0.7)
Eosinophil %: 1.6 %
HGB: 9.6 g/dL — ABNORMAL LOW (ref 12.0–16.0)
Lymphocyte %: 9.6 %
Monocyte #: 1.1 x10 3/mm — ABNORMAL HIGH (ref 0.2–0.9)
Neutrophil %: 82.4 %
Platelet: 235 x10 3/mm (ref 150–440)
RBC: 3.93 10*6/uL (ref 3.80–5.20)

## 2012-06-09 LAB — CBC CANCER CENTER
Eosinophil %: 1.4 %
HGB: 10.3 g/dL — ABNORMAL LOW (ref 12.0–16.0)
Lymphocyte #: 1.1 x10 3/mm (ref 1.0–3.6)
Lymphocyte %: 16 %
MCH: 24.2 pg — ABNORMAL LOW (ref 26.0–34.0)
Monocyte #: 0.4 x10 3/mm (ref 0.2–0.9)
Monocyte %: 6.5 %
Neutrophil %: 75.4 %
Platelet: 219 x10 3/mm (ref 150–440)
RBC: 4.25 10*6/uL (ref 3.80–5.20)
RDW: 21.7 % — ABNORMAL HIGH (ref 11.5–14.5)
WBC: 6.6 x10 3/mm (ref 3.6–11.0)

## 2012-06-15 LAB — CBC CANCER CENTER
Eosinophil %: 0.7 %
HGB: 10.2 g/dL — ABNORMAL LOW (ref 12.0–16.0)
Lymphocyte #: 1.2 x10 3/mm (ref 1.0–3.6)
Lymphocyte %: 16.8 %
MCH: 25.3 pg — ABNORMAL LOW (ref 26.0–34.0)
MCV: 80 fL (ref 80–100)
Monocyte #: 0.7 x10 3/mm (ref 0.2–0.9)
Monocyte %: 9.5 %
Neutrophil #: 5.3 x10 3/mm (ref 1.4–6.5)
Neutrophil %: 72.2 %
Platelet: 326 x10 3/mm (ref 150–440)
RBC: 4.05 10*6/uL (ref 3.80–5.20)
WBC: 7.4 x10 3/mm (ref 3.6–11.0)

## 2012-06-18 ENCOUNTER — Ambulatory Visit: Payer: Self-pay | Admitting: Gynecologic Oncology

## 2012-06-22 ENCOUNTER — Ambulatory Visit: Payer: Self-pay | Admitting: Gynecologic Oncology

## 2012-06-22 LAB — CBC CANCER CENTER
Basophil %: 0.8 %
Lymphocyte %: 32.6 %
MCHC: 31.3 g/dL — ABNORMAL LOW (ref 32.0–36.0)
MCV: 79 fL — ABNORMAL LOW (ref 80–100)
Monocyte %: 9.5 %
Neutrophil #: 2 x10 3/mm (ref 1.4–6.5)
Platelet: 285 x10 3/mm (ref 150–440)
RBC: 3.89 10*6/uL (ref 3.80–5.20)
RDW: 20.4 % — ABNORMAL HIGH (ref 11.5–14.5)
WBC: 3.7 x10 3/mm (ref 3.6–11.0)

## 2012-06-22 LAB — COMPREHENSIVE METABOLIC PANEL
Anion Gap: 5 — ABNORMAL LOW (ref 7–16)
Calcium, Total: 8.6 mg/dL (ref 8.5–10.1)
Chloride: 94 mmol/L — ABNORMAL LOW (ref 98–107)
Co2: 36 mmol/L — ABNORMAL HIGH (ref 21–32)
Creatinine: 1.38 mg/dL — ABNORMAL HIGH (ref 0.60–1.30)
EGFR (African American): 48 — ABNORMAL LOW
Glucose: 91 mg/dL (ref 65–99)
Potassium: 3.7 mmol/L (ref 3.5–5.1)
SGOT(AST): 12 U/L — ABNORMAL LOW (ref 15–37)
Sodium: 135 mmol/L — ABNORMAL LOW (ref 136–145)

## 2012-07-15 ENCOUNTER — Encounter: Payer: Self-pay | Admitting: Rheumatology

## 2012-07-19 ENCOUNTER — Ambulatory Visit: Payer: Self-pay | Admitting: Gynecologic Oncology

## 2012-07-21 ENCOUNTER — Encounter: Payer: Self-pay | Admitting: Rheumatology

## 2012-07-22 ENCOUNTER — Ambulatory Visit: Payer: Self-pay | Admitting: Gynecologic Oncology

## 2012-07-22 LAB — CBC CANCER CENTER
Eosinophil #: 0.1 x10 3/mm (ref 0.0–0.7)
Eosinophil %: 1.4 %
Lymphocyte #: 1 x10 3/mm (ref 1.0–3.6)
Lymphocyte %: 27.4 %
MCH: 22.5 pg — ABNORMAL LOW (ref 26.0–34.0)
MCHC: 29.5 g/dL — ABNORMAL LOW (ref 32.0–36.0)
Monocyte %: 7.5 %
Neutrophil #: 2.2 x10 3/mm (ref 1.4–6.5)
Platelet: 195 x10 3/mm (ref 150–440)
RBC: 3.98 10*6/uL (ref 3.80–5.20)
RDW: 18.6 % — ABNORMAL HIGH (ref 11.5–14.5)
WBC: 3.6 x10 3/mm (ref 3.6–11.0)

## 2012-07-22 LAB — COMPREHENSIVE METABOLIC PANEL
Albumin: 3.4 g/dL (ref 3.4–5.0)
Alkaline Phosphatase: 66 U/L (ref 50–136)
Anion Gap: 5 — ABNORMAL LOW (ref 7–16)
BUN: 17 mg/dL (ref 7–18)
Bilirubin,Total: 0.2 mg/dL (ref 0.2–1.0)
Calcium, Total: 8.7 mg/dL (ref 8.5–10.1)
Chloride: 95 mmol/L — ABNORMAL LOW (ref 98–107)
Co2: 36 mmol/L — ABNORMAL HIGH (ref 21–32)
Creatinine: 1.21 mg/dL (ref 0.60–1.30)
EGFR (African American): 56 — ABNORMAL LOW
EGFR (Non-African Amer.): 48 — ABNORMAL LOW
Glucose: 92 mg/dL (ref 65–99)
Osmolality: 273 (ref 275–301)
Potassium: 3.5 mmol/L (ref 3.5–5.1)

## 2012-07-24 LAB — CA 125: CA 125: 100.2 U/mL — ABNORMAL HIGH (ref 0.0–34.0)

## 2012-08-03 LAB — CBC CANCER CENTER
Basophil #: 0.1 x10 3/mm (ref 0.0–0.1)
Basophil %: 0.5 %
Eosinophil #: 0.1 x10 3/mm (ref 0.0–0.7)
Eosinophil %: 0.5 %
HCT: 32 % — ABNORMAL LOW (ref 35.0–47.0)
HGB: 9.3 g/dL — ABNORMAL LOW (ref 12.0–16.0)
Lymphocyte %: 5.4 %
Lymphs Abs: 0.6 x10 3/mm — ABNORMAL LOW (ref 1.0–3.6)
MCH: 22.6 pg — ABNORMAL LOW (ref 26.0–34.0)
MCHC: 29.2 g/dL — ABNORMAL LOW (ref 32.0–36.0)
MCV: 78 fL — ABNORMAL LOW (ref 80–100)
Monocyte #: 0.3 x10 3/mm (ref 0.2–0.9)
Monocyte %: 2.7 %
Neutrophil #: 10.6 x10 3/mm — ABNORMAL HIGH (ref 1.4–6.5)
Neutrophil %: 90.9 %
Platelet: 178 x10 3/mm (ref 150–440)
RBC: 4.12 x10 6/mm (ref 3.80–5.20)
RDW: 20.2 % — ABNORMAL HIGH (ref 11.5–14.5)
WBC: 11.6 x10 3/mm — ABNORMAL HIGH (ref 3.6–11.0)

## 2012-08-04 ENCOUNTER — Other Ambulatory Visit: Payer: Self-pay | Admitting: Rheumatology

## 2012-08-04 LAB — BODY FLUID CELL COUNT WITH DIFFERENTIAL
Basophil: 0 %
Eosinophil: 0 %
Nucleated Cell Count: 399 /mm3
Other Cells BF: 0 %

## 2012-08-04 LAB — SYNOVIAL FLUID, CRYSTAL: Crystals, Joint Fluid: NONE SEEN

## 2012-08-10 LAB — CBC CANCER CENTER
Basophil #: 0 x10 3/mm (ref 0.0–0.1)
Eosinophil #: 0 x10 3/mm (ref 0.0–0.7)
HCT: 31.1 % — ABNORMAL LOW (ref 35.0–47.0)
HGB: 9.2 g/dL — ABNORMAL LOW (ref 12.0–16.0)
Lymphocyte #: 0.8 x10 3/mm — ABNORMAL LOW (ref 1.0–3.6)
Lymphocyte %: 9.7 %
MCHC: 29.5 g/dL — ABNORMAL LOW (ref 32.0–36.0)
Monocyte %: 5.8 %
Neutrophil #: 6.9 x10 3/mm — ABNORMAL HIGH (ref 1.4–6.5)
Platelet: 233 x10 3/mm (ref 150–440)
RDW: 21.4 % — ABNORMAL HIGH (ref 11.5–14.5)
WBC: 8.3 x10 3/mm (ref 3.6–11.0)

## 2012-08-17 LAB — CBC CANCER CENTER
Basophil %: 0.6 %
Eosinophil #: 0 x10 3/mm (ref 0.0–0.7)
HCT: 36.2 % (ref 35.0–47.0)
HGB: 10.6 g/dL — ABNORMAL LOW (ref 12.0–16.0)
MCH: 22.9 pg — ABNORMAL LOW (ref 26.0–34.0)
MCHC: 29.4 g/dL — ABNORMAL LOW (ref 32.0–36.0)
MCV: 78 fL — ABNORMAL LOW (ref 80–100)
Monocyte #: 0.4 x10 3/mm (ref 0.2–0.9)
Neutrophil #: 10.3 x10 3/mm — ABNORMAL HIGH (ref 1.4–6.5)
WBC: 11.3 x10 3/mm — ABNORMAL HIGH (ref 3.6–11.0)

## 2012-08-18 ENCOUNTER — Ambulatory Visit: Payer: Self-pay | Admitting: Gynecologic Oncology

## 2012-08-18 ENCOUNTER — Encounter: Payer: Self-pay | Admitting: Rheumatology

## 2012-08-24 ENCOUNTER — Ambulatory Visit: Payer: Self-pay | Admitting: Gynecologic Oncology

## 2012-08-24 LAB — CBC CANCER CENTER
Basophil #: 0 x10 3/mm (ref 0.0–0.1)
Eosinophil %: 0.8 %
HCT: 34.3 % — ABNORMAL LOW (ref 35.0–47.0)
HGB: 10.3 g/dL — ABNORMAL LOW (ref 12.0–16.0)
Lymphocyte %: 10.2 %
MCH: 23.9 pg — ABNORMAL LOW (ref 26.0–34.0)
MCV: 80 fL (ref 80–100)
Monocyte #: 0.3 x10 3/mm (ref 0.2–0.9)
Monocyte %: 6.3 %
Neutrophil %: 81.9 %
Platelet: 339 x10 3/mm (ref 150–440)
RBC: 4.3 10*6/uL (ref 3.80–5.20)
RDW: 24.6 % — ABNORMAL HIGH (ref 11.5–14.5)

## 2012-08-24 LAB — COMPREHENSIVE METABOLIC PANEL
Albumin: 3.6 g/dL (ref 3.4–5.0)
BUN: 16 mg/dL (ref 7–18)
Bilirubin,Total: 0.2 mg/dL (ref 0.2–1.0)
Chloride: 99 mmol/L (ref 98–107)
Creatinine: 1.13 mg/dL (ref 0.60–1.30)
EGFR (African American): >60
Glucose: 101 mg/dL — ABNORMAL HIGH (ref 65–99)
Osmolality: 275 (ref 275–301)
SGPT (ALT): 19 U/L (ref 12–78)
Total Protein: 6.6 g/dL (ref 6.4–8.2)

## 2012-09-07 LAB — CBC CANCER CENTER
Basophil %: 0.8 %
Eosinophil #: 0.1 x10 3/mm (ref 0.0–0.7)
Eosinophil %: 1 %
HCT: 38 % (ref 35.0–47.0)
HGB: 11.5 g/dL — ABNORMAL LOW (ref 12.0–16.0)
Lymphocyte #: 0.6 x10 3/mm — ABNORMAL LOW (ref 1.0–3.6)
Lymphocyte %: 10.6 %
MCH: 25.5 pg — ABNORMAL LOW (ref 26.0–34.0)
MCV: 84 fL (ref 80–100)
Monocyte #: 0.4 x10 3/mm (ref 0.2–0.9)
Neutrophil #: 4.2 x10 3/mm (ref 1.4–6.5)
Neutrophil %: 80.6 %
RBC: 4.5 10*6/uL (ref 3.80–5.20)
RDW: 27.8 % — ABNORMAL HIGH (ref 11.5–14.5)

## 2012-09-07 LAB — COMPREHENSIVE METABOLIC PANEL
Albumin: 3.5 g/dL (ref 3.4–5.0)
Anion Gap: 8 (ref 7–16)
BUN: 14 mg/dL (ref 7–18)
Bilirubin,Total: 0.2 mg/dL (ref 0.2–1.0)
Chloride: 97 mmol/L — ABNORMAL LOW (ref 98–107)
Co2: 30 mmol/L (ref 21–32)
Creatinine: 1 mg/dL (ref 0.60–1.30)
EGFR (African American): >60
Osmolality: 270 (ref 275–301)
Potassium: 4.3 mmol/L (ref 3.5–5.1)
Sodium: 135 mmol/L — ABNORMAL LOW (ref 136–145)
Total Protein: 6.7 g/dL (ref 6.4–8.2)

## 2012-09-18 ENCOUNTER — Ambulatory Visit: Payer: Self-pay | Admitting: Gynecologic Oncology

## 2012-09-18 ENCOUNTER — Encounter: Payer: Self-pay | Admitting: Rheumatology

## 2012-09-21 LAB — CBC CANCER CENTER
Basophil %: 1 %
Eosinophil #: 0.1 x10 3/mm (ref 0.0–0.7)
Eosinophil %: 2.2 %
HCT: 38.2 % (ref 35.0–47.0)
HGB: 11.7 g/dL — ABNORMAL LOW (ref 12.0–16.0)
Lymphocyte #: 0.8 x10 3/mm — ABNORMAL LOW (ref 1.0–3.6)
Lymphocyte %: 24.5 %
MCH: 26.7 pg (ref 26.0–34.0)
MCHC: 30.5 g/dL — ABNORMAL LOW (ref 32.0–36.0)
MCV: 88 fL (ref 80–100)
Monocyte #: 0.2 x10 3/mm (ref 0.2–0.9)
Monocyte %: 6.6 %
Neutrophil #: 2.1 x10 3/mm (ref 1.4–6.5)
Neutrophil %: 65.7 %
Platelet: 186 x10 3/mm (ref 150–440)

## 2012-10-05 LAB — CBC CANCER CENTER
Basophil #: 0 x10 3/mm (ref 0.0–0.1)
Basophil %: 0.3 %
Eosinophil %: 0.6 %
HGB: 13.2 g/dL (ref 12.0–16.0)
Lymphocyte #: 0.5 x10 3/mm — ABNORMAL LOW (ref 1.0–3.6)
Lymphocyte %: 6.7 %
Monocyte #: 0.2 x10 3/mm (ref 0.2–0.9)
Neutrophil %: 90.1 %
RBC: 4.71 10*6/uL (ref 3.80–5.20)
WBC: 7.7 x10 3/mm (ref 3.6–11.0)

## 2012-10-18 ENCOUNTER — Ambulatory Visit: Payer: Self-pay | Admitting: Gynecologic Oncology

## 2012-10-19 LAB — CBC CANCER CENTER
Basophil #: 0 x10 3/mm (ref 0.0–0.1)
Basophil %: 0.6 %
Eosinophil %: 0.7 %
HCT: 39.4 % (ref 35.0–47.0)
HGB: 12.7 g/dL (ref 12.0–16.0)
Lymphocyte #: 0.4 x10 3/mm — ABNORMAL LOW (ref 1.0–3.6)
MCV: 91 fL (ref 80–100)
Monocyte %: 2.2 %
Neutrophil #: 5 x10 3/mm (ref 1.4–6.5)
RBC: 4.32 10*6/uL (ref 3.80–5.20)
RDW: 26.1 % — ABNORMAL HIGH (ref 11.5–14.5)
WBC: 5.6 x10 3/mm (ref 3.6–11.0)

## 2012-11-02 LAB — CBC CANCER CENTER
Basophil #: 0.1 x10 3/mm (ref 0.0–0.1)
Eosinophil #: 0 x10 3/mm (ref 0.0–0.7)
Eosinophil %: 1 %
HCT: 42.3 % (ref 35.0–47.0)
HGB: 14.1 g/dL (ref 12.0–16.0)
Lymphocyte #: 0.8 x10 3/mm — ABNORMAL LOW (ref 1.0–3.6)
MCH: 31.5 pg (ref 26.0–34.0)
MCV: 95 fL (ref 80–100)
Neutrophil %: 72.4 %
Platelet: 219 x10 3/mm (ref 150–440)
RBC: 4.46 10*6/uL (ref 3.80–5.20)
RDW: 23.9 % — ABNORMAL HIGH (ref 11.5–14.5)
WBC: 3.8 x10 3/mm (ref 3.6–11.0)

## 2012-11-05 LAB — CA 125: CA 125: 414.2 U/mL — ABNORMAL HIGH (ref 0.0–34.0)

## 2012-11-18 ENCOUNTER — Ambulatory Visit: Payer: Self-pay | Admitting: Gynecologic Oncology

## 2012-11-23 LAB — CBC CANCER CENTER
Basophil #: 0.1 x10 3/mm (ref 0.0–0.1)
Eosinophil #: 0.1 x10 3/mm (ref 0.0–0.7)
Eosinophil %: 1.4 %
Lymphocyte #: 0.8 x10 3/mm — ABNORMAL LOW (ref 1.0–3.6)
Lymphocyte %: 22.8 %
MCH: 32.5 pg (ref 26.0–34.0)
MCHC: 33.4 g/dL (ref 32.0–36.0)
MCV: 97 fL (ref 80–100)
Monocyte %: 3.5 %
Neutrophil #: 2.5 x10 3/mm (ref 1.4–6.5)
Platelet: 221 x10 3/mm (ref 150–440)
RDW: 17.7 % — ABNORMAL HIGH (ref 11.5–14.5)

## 2012-11-23 LAB — COMPREHENSIVE METABOLIC PANEL
Albumin: 3.5 g/dL (ref 3.4–5.0)
Alkaline Phosphatase: 91 U/L (ref 50–136)
BUN: 18 mg/dL (ref 7–18)
Co2: 35 mmol/L — ABNORMAL HIGH (ref 21–32)
Creatinine: 1.27 mg/dL (ref 0.60–1.30)
EGFR (Non-African Amer.): 46 — ABNORMAL LOW
Glucose: 85 mg/dL (ref 65–99)
Osmolality: 277 (ref 275–301)
SGOT(AST): 18 U/L (ref 15–37)
SGPT (ALT): 27 U/L (ref 12–78)
Total Protein: 6.8 g/dL (ref 6.4–8.2)

## 2012-11-24 LAB — CA 125: CA 125: 483.1 U/mL — ABNORMAL HIGH (ref 0.0–34.0)

## 2012-11-30 LAB — COMPREHENSIVE METABOLIC PANEL
Albumin: 3.5 g/dL (ref 3.4–5.0)
Anion Gap: 9 (ref 7–16)
BUN: 14 mg/dL (ref 7–18)
Bilirubin,Total: 0.3 mg/dL (ref 0.2–1.0)
Chloride: 96 mmol/L — ABNORMAL LOW (ref 98–107)
Creatinine: 1.23 mg/dL (ref 0.60–1.30)
EGFR (African American): 55 — ABNORMAL LOW
Osmolality: 272 (ref 275–301)
SGOT(AST): 26 U/L (ref 15–37)

## 2012-11-30 LAB — CBC CANCER CENTER
Basophil #: 0 x10 3/mm (ref 0.0–0.1)
Basophil %: 0.4 %
Eosinophil #: 0 x10 3/mm (ref 0.0–0.7)
HGB: 13.7 g/dL (ref 12.0–16.0)
Lymphocyte #: 0.4 x10 3/mm — ABNORMAL LOW (ref 1.0–3.6)
Lymphocyte %: 8.5 %
Neutrophil #: 4.5 x10 3/mm (ref 1.4–6.5)
Neutrophil %: 88.9 %
RDW: 17.5 % — ABNORMAL HIGH (ref 11.5–14.5)
WBC: 5.1 x10 3/mm (ref 3.6–11.0)

## 2012-12-07 LAB — CBC CANCER CENTER
Basophil %: 2.8 %
Eosinophil %: 7 %
HCT: 34.8 % — ABNORMAL LOW (ref 35.0–47.0)
HGB: 11.7 g/dL — ABNORMAL LOW (ref 12.0–16.0)
Lymphocyte %: 55.1 %
MCHC: 33.6 g/dL (ref 32.0–36.0)
MCV: 99 fL (ref 80–100)
Monocyte #: 0 x10 3/mm — ABNORMAL LOW (ref 0.2–0.9)
Monocyte %: 5.9 %
Neutrophil %: 29.2 %
RDW: 16.2 % — ABNORMAL HIGH (ref 11.5–14.5)
WBC: 0.7 x10 3/mm — CL (ref 3.6–11.0)

## 2012-12-14 LAB — CBC CANCER CENTER
Basophil %: 1.6 %
Eosinophil #: 0 x10 3/mm (ref 0.0–0.7)
HCT: 33.6 % — ABNORMAL LOW (ref 35.0–47.0)
Lymphocyte #: 0.3 x10 3/mm — ABNORMAL LOW (ref 1.0–3.6)
Lymphocyte %: 14.4 %
MCHC: 33 g/dL (ref 32.0–36.0)
MCV: 98 fL (ref 80–100)
Platelet: 313 x10 3/mm (ref 150–440)
RDW: 16.6 % — ABNORMAL HIGH (ref 11.5–14.5)
WBC: 2.1 x10 3/mm — ABNORMAL LOW (ref 3.6–11.0)

## 2012-12-19 ENCOUNTER — Ambulatory Visit: Payer: Self-pay | Admitting: Gynecologic Oncology

## 2012-12-21 LAB — CBC CANCER CENTER
Basophil #: 0.1 x10 3/mm (ref 0.0–0.1)
Basophil %: 1.6 %
Lymphocyte %: 6.4 %
MCH: 32.3 pg (ref 26.0–34.0)
MCHC: 33.3 g/dL (ref 32.0–36.0)
MCV: 97 fL (ref 80–100)
Monocyte %: 3.4 %
Neutrophil #: 5.8 x10 3/mm (ref 1.4–6.5)
Neutrophil %: 87.3 %

## 2012-12-21 LAB — COMPREHENSIVE METABOLIC PANEL WITH GFR
Albumin: 2.8 g/dL — ABNORMAL LOW
Alkaline Phosphatase: 137 U/L — ABNORMAL HIGH
Anion Gap: 3 — ABNORMAL LOW
BUN: 13 mg/dL
Bilirubin,Total: 0.3 mg/dL
Calcium, Total: 8.5 mg/dL
Chloride: 97 mmol/L — ABNORMAL LOW
Co2: 36 mmol/L — ABNORMAL HIGH
Creatinine: 1.08 mg/dL
EGFR (African American): >60
EGFR (Non-African Amer.): 55 — ABNORMAL LOW
Glucose: 132 mg/dL — ABNORMAL HIGH
Osmolality: 274
Potassium: 4.1 mmol/L
SGOT(AST): 14 U/L — ABNORMAL LOW
SGPT (ALT): 20 U/L
Sodium: 136 mmol/L
Total Protein: 6.6 g/dL

## 2012-12-28 LAB — CBC CANCER CENTER
Basophil #: 0.1 x10 3/mm (ref 0.0–0.1)
Eosinophil #: 0 x10 3/mm (ref 0.0–0.7)
Eosinophil %: 5.1 %
HGB: 11.7 g/dL — ABNORMAL LOW (ref 12.0–16.0)
Lymphocyte #: 0.3 x10 3/mm — ABNORMAL LOW (ref 1.0–3.6)
MCHC: 33 g/dL (ref 32.0–36.0)
MCV: 97 fL (ref 80–100)
Monocyte #: 0.1 x10 3/mm — ABNORMAL LOW (ref 0.2–0.9)
Monocyte %: 6.6 %
Neutrophil %: 44 %
RBC: 3.64 10*6/uL — ABNORMAL LOW (ref 3.80–5.20)
WBC: 0.9 x10 3/mm — CL (ref 3.6–11.0)

## 2013-01-04 LAB — CBC CANCER CENTER
Basophil #: 0.1 x10 3/mm (ref 0.0–0.1)
Basophil %: 1 %
Eosinophil #: 0.1 x10 3/mm (ref 0.0–0.7)
Eosinophil %: 1.1 %
HCT: 34.3 % — ABNORMAL LOW (ref 35.0–47.0)
HGB: 11.3 g/dL — ABNORMAL LOW (ref 12.0–16.0)
Lymphocyte #: 0.4 x10 3/mm — ABNORMAL LOW (ref 1.0–3.6)
Lymphocyte %: 5.5 %
MCH: 32 pg (ref 26.0–34.0)
MCHC: 33 g/dL (ref 32.0–36.0)
MCV: 97 fL (ref 80–100)
Monocyte #: 0.1 x10 3/mm — ABNORMAL LOW (ref 0.2–0.9)
Monocyte %: 1.7 %
Neutrophil #: 5.9 x10 3/mm (ref 1.4–6.5)
Neutrophil %: 90.7 %
Platelet: 304 x10 3/mm (ref 150–440)
RBC: 3.55 10*6/uL — ABNORMAL LOW (ref 3.80–5.20)
RDW: 17.4 % — ABNORMAL HIGH (ref 11.5–14.5)
WBC: 6.5 x10 3/mm (ref 3.6–11.0)

## 2013-01-11 LAB — CBC CANCER CENTER
Eosinophil #: 0.1 x10 3/mm (ref 0.0–0.7)
Eosinophil %: 4.3 %
Lymphocyte #: 0.4 x10 3/mm — ABNORMAL LOW (ref 1.0–3.6)
MCHC: 33 g/dL (ref 32.0–36.0)
MCV: 94 fL (ref 80–100)
Monocyte #: 0.2 x10 3/mm (ref 0.2–0.9)
Neutrophil %: 73.8 %
Platelet: 483 x10 3/mm — ABNORMAL HIGH (ref 150–440)
RBC: 4.06 10*6/uL (ref 3.80–5.20)
RDW: 17.7 % — ABNORMAL HIGH (ref 11.5–14.5)
WBC: 3.3 x10 3/mm — ABNORMAL LOW (ref 3.6–11.0)

## 2013-01-12 LAB — CA 125: CA 125: 329.9 U/mL — ABNORMAL HIGH (ref 0.0–34.0)

## 2013-01-16 ENCOUNTER — Ambulatory Visit: Payer: Self-pay | Admitting: Gynecologic Oncology

## 2013-01-18 LAB — CBC CANCER CENTER
Basophil %: 0.7 %
HCT: 35.3 % (ref 35.0–47.0)
HGB: 11.4 g/dL — ABNORMAL LOW (ref 12.0–16.0)
Lymphocyte #: 0.5 x10 3/mm — ABNORMAL LOW (ref 1.0–3.6)
Lymphocyte %: 4.9 %
MCHC: 32.3 g/dL (ref 32.0–36.0)
Monocyte #: 0.3 x10 3/mm (ref 0.2–0.9)
Monocyte %: 2.5 %
Neutrophil #: 9.1 x10 3/mm — ABNORMAL HIGH (ref 1.4–6.5)
RDW: 18.6 % — ABNORMAL HIGH (ref 11.5–14.5)

## 2013-01-18 LAB — COMPREHENSIVE METABOLIC PANEL
Albumin: 3.2 g/dL — ABNORMAL LOW (ref 3.4–5.0)
Alkaline Phosphatase: 125 U/L (ref 50–136)
Anion Gap: 6 — ABNORMAL LOW (ref 7–16)
BUN: 16 mg/dL (ref 7–18)
Bilirubin,Total: 0.2 mg/dL (ref 0.2–1.0)
Co2: 35 mmol/L — ABNORMAL HIGH (ref 21–32)
Creatinine: 1.17 mg/dL (ref 0.60–1.30)
Osmolality: 278 (ref 275–301)
Sodium: 138 mmol/L (ref 136–145)
Total Protein: 6.8 g/dL (ref 6.4–8.2)

## 2013-01-25 LAB — CBC CANCER CENTER
Basophil #: 0 x10 3/mm (ref 0.0–0.1)
Basophil %: 0.4 %
Eosinophil #: 0.1 x10 3/mm (ref 0.0–0.7)
Eosinophil %: 4.1 %
HCT: 32.3 % — ABNORMAL LOW (ref 35.0–47.0)
Lymphocyte #: 0.2 x10 3/mm — ABNORMAL LOW (ref 1.0–3.6)
MCH: 30.6 pg (ref 26.0–34.0)
MCHC: 32.5 g/dL (ref 32.0–36.0)
Monocyte %: 4.8 %
Neutrophil #: 1.1 x10 3/mm — ABNORMAL LOW (ref 1.4–6.5)
Neutrophil %: 74.6 %
Platelet: 210 x10 3/mm (ref 150–440)
RBC: 3.43 10*6/uL — ABNORMAL LOW (ref 3.80–5.20)
WBC: 1.5 x10 3/mm — CL (ref 3.6–11.0)

## 2013-02-01 LAB — CBC CANCER CENTER
Basophil #: 0.1 x10 3/mm (ref 0.0–0.1)
Eosinophil #: 0.1 x10 3/mm (ref 0.0–0.7)
HCT: 35.4 % (ref 35.0–47.0)
HGB: 11.6 g/dL — ABNORMAL LOW (ref 12.0–16.0)
Lymphocyte #: 0.4 x10 3/mm — ABNORMAL LOW (ref 1.0–3.6)
MCH: 30.6 pg (ref 26.0–34.0)
Neutrophil #: 8.2 x10 3/mm — ABNORMAL HIGH (ref 1.4–6.5)
Platelet: 291 x10 3/mm (ref 150–440)
RBC: 3.78 10*6/uL — ABNORMAL LOW (ref 3.80–5.20)
RDW: 19 % — ABNORMAL HIGH (ref 11.5–14.5)
WBC: 8.9 x10 3/mm (ref 3.6–11.0)

## 2013-02-09 LAB — COMPREHENSIVE METABOLIC PANEL
BUN: 19 mg/dL — ABNORMAL HIGH (ref 7–18)
Bilirubin,Total: 0.1 mg/dL — ABNORMAL LOW (ref 0.2–1.0)
Chloride: 97 mmol/L — ABNORMAL LOW (ref 98–107)
Co2: 32 mmol/L (ref 21–32)
Creatinine: 1.1 mg/dL (ref 0.60–1.30)
EGFR (Non-African Amer.): 54 — ABNORMAL LOW
Osmolality: 275 (ref 275–301)
SGOT(AST): 14 U/L — ABNORMAL LOW (ref 15–37)
Sodium: 136 mmol/L (ref 136–145)

## 2013-02-09 LAB — CBC CANCER CENTER
Basophil #: 0.1 x10 3/mm (ref 0.0–0.1)
Eosinophil #: 0.1 x10 3/mm (ref 0.0–0.7)
Eosinophil %: 2.2 %
HCT: 34.8 % — ABNORMAL LOW (ref 35.0–47.0)
Lymphocyte %: 9.4 %
MCV: 93 fL (ref 80–100)
Neutrophil #: 4.2 x10 3/mm (ref 1.4–6.5)
RDW: 18.8 % — ABNORMAL HIGH (ref 11.5–14.5)

## 2013-02-10 LAB — CA 125: CA 125: 385.3 U/mL — ABNORMAL HIGH (ref 0.0–34.0)

## 2013-02-16 ENCOUNTER — Ambulatory Visit: Payer: Self-pay | Admitting: Gynecologic Oncology

## 2013-02-16 LAB — CBC CANCER CENTER
Basophil #: 0 x10 3/mm (ref 0.0–0.1)
Eosinophil #: 0.1 x10 3/mm (ref 0.0–0.7)
Eosinophil %: 3.9 %
HCT: 34.8 % — ABNORMAL LOW (ref 35.0–47.0)
HGB: 11.1 g/dL — ABNORMAL LOW (ref 12.0–16.0)
Lymphocyte #: 0.4 x10 3/mm — ABNORMAL LOW (ref 1.0–3.6)
MCH: 29.7 pg (ref 26.0–34.0)
MCHC: 31.9 g/dL — ABNORMAL LOW (ref 32.0–36.0)
Monocyte #: 0.1 x10 3/mm — ABNORMAL LOW (ref 0.2–0.9)
Monocyte %: 4.9 %
Neutrophil %: 70.4 %
Platelet: 168 x10 3/mm (ref 150–440)
RBC: 3.74 10*6/uL — ABNORMAL LOW (ref 3.80–5.20)
RDW: 18.9 % — ABNORMAL HIGH (ref 11.5–14.5)

## 2013-02-22 ENCOUNTER — Ambulatory Visit: Payer: Self-pay | Admitting: Rheumatology

## 2013-02-22 ENCOUNTER — Emergency Department: Payer: Self-pay | Admitting: Emergency Medicine

## 2013-02-22 LAB — BASIC METABOLIC PANEL
Anion Gap: 6 — ABNORMAL LOW (ref 7–16)
BUN: 15 mg/dL (ref 7–18)
Calcium, Total: 8.6 mg/dL (ref 8.5–10.1)
Co2: 30 mmol/L (ref 21–32)
Creatinine: 0.9 mg/dL (ref 0.60–1.30)
EGFR (African American): >60
EGFR (Non-African Amer.): >60
Glucose: 93 mg/dL (ref 65–99)
Osmolality: 261 (ref 275–301)
Sodium: 130 mmol/L — ABNORMAL LOW (ref 136–145)

## 2013-02-22 LAB — PROTIME-INR
INR: 1.2
Prothrombin Time: 15.4 secs — ABNORMAL HIGH (ref 11.5–14.7)

## 2013-02-22 LAB — APTT: Activated PTT: 30.9 secs (ref 23.6–35.9)

## 2013-02-23 LAB — CBC CANCER CENTER
Eosinophil #: 0 x10 3/mm (ref 0.0–0.7)
HCT: 33.4 % — ABNORMAL LOW (ref 35.0–47.0)
HGB: 10.8 g/dL — ABNORMAL LOW (ref 12.0–16.0)
MCH: 29.8 pg (ref 26.0–34.0)
MCHC: 32.3 g/dL (ref 32.0–36.0)
MCV: 92 fL (ref 80–100)
Neutrophil %: 94.5 %
Platelet: 239 x10 3/mm (ref 150–440)

## 2013-02-26 LAB — PROTIME-INR: INR: 8

## 2013-03-01 LAB — PROTIME-INR: Prothrombin Time: 28.8 secs — ABNORMAL HIGH (ref 11.5–14.7)

## 2013-03-02 LAB — COMPREHENSIVE METABOLIC PANEL
Albumin: 2.6 g/dL — ABNORMAL LOW (ref 3.4–5.0)
Anion Gap: 6 — ABNORMAL LOW (ref 7–16)
BUN: 12 mg/dL (ref 7–18)
Bilirubin,Total: 0.2 mg/dL (ref 0.2–1.0)
Calcium, Total: 8.7 mg/dL (ref 8.5–10.1)
EGFR (Non-African Amer.): 53 — ABNORMAL LOW
Glucose: 119 mg/dL — ABNORMAL HIGH (ref 65–99)
Osmolality: 269 (ref 275–301)
Potassium: 3.7 mmol/L (ref 3.5–5.1)
SGOT(AST): 22 U/L (ref 15–37)
Sodium: 134 mmol/L — ABNORMAL LOW (ref 136–145)

## 2013-03-02 LAB — CBC CANCER CENTER
Basophil %: 1.4 %
HCT: 34.3 % — ABNORMAL LOW (ref 35.0–47.0)
Lymphocyte #: 0.4 x10 3/mm — ABNORMAL LOW (ref 1.0–3.6)
MCH: 29.6 pg (ref 26.0–34.0)
MCV: 93 fL (ref 80–100)
Monocyte %: 6.9 %
Platelet: 363 x10 3/mm (ref 150–440)
RBC: 3.7 10*6/uL — ABNORMAL LOW (ref 3.80–5.20)
RDW: 20 % — ABNORMAL HIGH (ref 11.5–14.5)
WBC: 5.3 x10 3/mm (ref 3.6–11.0)

## 2013-03-09 LAB — CBC CANCER CENTER
Basophil %: 0.1 %
HGB: 10.6 g/dL — ABNORMAL LOW (ref 12.0–16.0)
MCHC: 31.9 g/dL — ABNORMAL LOW (ref 32.0–36.0)
Monocyte #: 0.1 x10 3/mm — ABNORMAL LOW (ref 0.2–0.9)
Monocyte %: 5.3 %
Neutrophil #: 0.6 x10 3/mm — ABNORMAL LOW (ref 1.4–6.5)
Platelet: 236 x10 3/mm (ref 150–440)
RBC: 3.58 10*6/uL — ABNORMAL LOW (ref 3.80–5.20)
RDW: 19.7 % — ABNORMAL HIGH (ref 11.5–14.5)
WBC: 1 x10 3/mm — CL (ref 3.6–11.0)

## 2013-03-09 LAB — PROTIME-INR
INR: 5.5
Prothrombin Time: 47.4 secs — ABNORMAL HIGH (ref 11.5–14.7)

## 2013-03-16 LAB — CBC CANCER CENTER
Basophil %: 0.8 %
Eosinophil #: 0.1 x10 3/mm (ref 0.0–0.7)
Eosinophil %: 2.4 %
HCT: 33.6 % — ABNORMAL LOW (ref 35.0–47.0)
HGB: 10.7 g/dL — ABNORMAL LOW (ref 12.0–16.0)
Lymphocyte #: 0.3 x10 3/mm — ABNORMAL LOW (ref 1.0–3.6)
Lymphocyte %: 7.4 %
MCH: 29.1 pg (ref 26.0–34.0)
MCHC: 31.8 g/dL — ABNORMAL LOW (ref 32.0–36.0)
MCV: 92 fL (ref 80–100)
Neutrophil #: 3.5 x10 3/mm (ref 1.4–6.5)
Neutrophil %: 83.7 %
Platelet: 283 x10 3/mm (ref 150–440)
RBC: 3.66 10*6/uL — ABNORMAL LOW (ref 3.80–5.20)
RDW: 19.6 % — ABNORMAL HIGH (ref 11.5–14.5)

## 2013-03-18 ENCOUNTER — Ambulatory Visit: Payer: Self-pay | Admitting: Gynecologic Oncology

## 2013-03-23 LAB — COMPREHENSIVE METABOLIC PANEL
Anion Gap: 8 (ref 7–16)
Bilirubin,Total: 0.2 mg/dL (ref 0.2–1.0)
Chloride: 93 mmol/L — ABNORMAL LOW (ref 98–107)
Creatinine: 1.06 mg/dL (ref 0.60–1.30)
EGFR (Non-African Amer.): 57 — ABNORMAL LOW
Glucose: 130 mg/dL — ABNORMAL HIGH (ref 65–99)
Osmolality: 268 (ref 275–301)
SGPT (ALT): 24 U/L (ref 12–78)
Sodium: 132 mmol/L — ABNORMAL LOW (ref 136–145)
Total Protein: 6.6 g/dL (ref 6.4–8.2)

## 2013-03-23 LAB — CBC CANCER CENTER
Basophil #: 0.1 x10 3/mm (ref 0.0–0.1)
Basophil %: 1.3 %
Eosinophil %: 1.4 %
HCT: 34 % — ABNORMAL LOW (ref 35.0–47.0)
HGB: 10.9 g/dL — ABNORMAL LOW (ref 12.0–16.0)
Monocyte #: 0.3 x10 3/mm (ref 0.2–0.9)
Neutrophil #: 4.9 x10 3/mm (ref 1.4–6.5)
Neutrophil %: 87.9 %
RBC: 3.75 10*6/uL — ABNORMAL LOW (ref 3.80–5.20)
RDW: 19.4 % — ABNORMAL HIGH (ref 11.5–14.5)
WBC: 5.6 x10 3/mm (ref 3.6–11.0)

## 2013-03-23 LAB — PROTIME-INR
INR: 2.2
Prothrombin Time: 23.8 s — ABNORMAL HIGH

## 2013-03-24 ENCOUNTER — Encounter: Payer: Self-pay | Admitting: *Deleted

## 2013-03-25 ENCOUNTER — Ambulatory Visit (INDEPENDENT_AMBULATORY_CARE_PROVIDER_SITE_OTHER): Payer: Medicare Other | Admitting: General Surgery

## 2013-03-25 ENCOUNTER — Encounter: Payer: Self-pay | Admitting: General Surgery

## 2013-03-25 VITALS — BP 130/70 | HR 70 | Resp 14 | Ht 64.0 in | Wt 136.0 lb

## 2013-03-25 DIAGNOSIS — Z8543 Personal history of malignant neoplasm of ovary: Secondary | ICD-10-CM

## 2013-03-25 DIAGNOSIS — R609 Edema, unspecified: Secondary | ICD-10-CM

## 2013-03-25 DIAGNOSIS — R600 Localized edema: Secondary | ICD-10-CM | POA: Insufficient documentation

## 2013-03-25 NOTE — Progress Notes (Signed)
She Patient ID: Wanda Mann, female   DOB: Apr 17, 1951, 62 y.o.   MRN: [id number]  Chief Complaint  Patient presents with  . Other    lymphedema    HPI Wanda Mann is a 63 y.o. female.  Patient here today referred by Dr Doylene Canning. for evaluation of lymphedema of lower left leg for about a month.   Started weeping on and off but not continuously. An ultrasound was done at [facility].  She is still on chemotherapy for ovarian cancer that was diagnosed in 2010. She did have left knee surgery 2 years. HPI  Past Medical History  Diagnosis Date  . Stomach ulcer   . Cancer 2010    ovarian cancer     Past Surgical History  Procedure Laterality Date  . Colonoscopy  2010    Dr.Elliott  . Abdominal hysterectomy  1981  . Port a cath revision  2010  . Upper gi endoscopy  2010  . Laparoscopy  2010  . Abdominal surgery  2013  . Ovary surgery  2010    History reviewed. No pertinent family history.  Social History History  Substance Use Topics  . Smoking status: Never Smoker   . Smokeless tobacco: Never Used  . Alcohol Use: No    Allergies  Allergen Reactions  . Penicillins Hives    Current Outpatient Prescriptions  Medication Sig Dispense Refill  . alendronate (FOSAMAX) 70 MG tablet Take 70 mg by mouth every 7 (seven) days. Take with a full glass of water on an empty stomach.      Marland Kitchen amitriptyline (ELAVIL) 25 MG tablet Take 25 mg by mouth at bedtime.      . DULoxetine (CYMBALTA) 60 MG capsule Take 60 mg by mouth daily.      Marland Kitchen estrogens, conjugated, (PREMARIN) 0.625 MG tablet Take 0.625 mg by mouth daily. Take daily for 21 days then do not take for 7 days.      . fentaNYL (DURAGESIC - DOSED MCG/HR) 100 MCG/HR Place 2 patches onto the skin every 3 (three) days.      . fentaNYL (DURAGESIC - DOSED MCG/HR) 75 MCG/HR Place 1 patch onto the skin every 3 (three) days.      . furosemide (LASIX) 40 MG tablet Take 40 mg by mouth daily.      Marland Kitchen levothyroxine (SYNTHROID, LEVOTHROID) 50 MCG tablet  Take 50 mcg by mouth daily before breakfast.      . omeprazole (PRILOSEC) 20 MG capsule Take 20 mg by mouth daily.      . ondansetron (ZOFRAN) 4 MG tablet Take 4 mg by mouth every 8 (eight) hours as needed for nausea.      . prednisoLONE 5 MG TABS Take 5 mg by mouth 2 (two) times daily.      . promethazine (PHENERGAN) 25 MG tablet Take 25 mg by mouth every 6 (six) hours as needed for nausea.      Marland Kitchen rOPINIRole (REQUIP) 0.5 MG tablet Take 0.5 mg by mouth 3 (three) times daily.      . traMADol (ULTRAM) 50 MG tablet Take 50 mg by mouth every 6 (six) hours as needed for pain.      . valACYclovir (VALTREX) 1000 MG tablet Take 1,000 mg by mouth 2 (two) times daily.      Marland Kitchen warfarin (COUMADIN) 5 MG tablet Take 5 mg by mouth daily.      Marland Kitchen zolpidem (AMBIEN) 10 MG tablet Take 10 mg by mouth at bedtime as needed for  sleep.       No current facility-administered medications for this visit.    Review of Systems Review of Systems  Constitutional: Negative.   Respiratory: Negative.   Cardiovascular: Negative.     Blood pressure 130/70, pulse 70, resp. rate 14, height 5\' 4"  (1.626 m), weight 136 lb (61.689 kg).  Physical Exam Physical Exam  Constitutional: She is oriented to person, place, and time. She appears well-developed and well-nourished.  Cardiovascular: Normal rate and intact distal pulses.  An irregular rhythm present.  Pulses:      Dorsalis pedis pulses are 2+ on the right side, and 2+ on the left side.       Posterior tibial pulses are 2+ on the right side, and 2+ on the left side.  Moderate edema left leg  no stasis changes  Pulmonary/Chest: Effort normal and breath sounds normal.  Abdominal: Soft. Bowel sounds are normal.  Neurological: She is alert and oriented to person, place, and time.  Skin: Skin is warm and dry.    Data Reviewed Doppler showed small clot in popliteal vein  Assessment    Leg edema, likely of venous origin    Plan    20-30 pressure compression hose to  left leg       SANKAR,SEEPLAPUTHUR G 03/25/2013, 8:24 PM

## 2013-03-25 NOTE — Patient Instructions (Addendum)
The patient is aware to call back for any questions or concerns. Keep leg elevated to get swelling down then apply the Compression hose for left leg -use daily

## 2013-03-29 LAB — CBC CANCER CENTER
Basophil #: 0.1 x10 3/mm (ref 0.0–0.1)
Basophil %: 0.6 %
Eosinophil #: 0.1 x10 3/mm (ref 0.0–0.7)
Eosinophil %: 0.4 %
HGB: 11.3 g/dL — ABNORMAL LOW (ref 12.0–16.0)
Lymphocyte #: 0.2 x10 3/mm — ABNORMAL LOW (ref 1.0–3.6)
MCH: 28.8 pg (ref 26.0–34.0)
MCHC: 32.3 g/dL (ref 32.0–36.0)
MCV: 89 fL (ref 80–100)
Monocyte %: 0.5 %
Neutrophil #: 17.7 x10 3/mm — ABNORMAL HIGH (ref 1.4–6.5)
Neutrophil %: 97.3 %
RBC: 3.92 10*6/uL (ref 3.80–5.20)
RDW: 19.4 % — ABNORMAL HIGH (ref 11.5–14.5)

## 2013-03-29 LAB — PROTIME-INR
INR: 2
Prothrombin Time: 22.6 secs — ABNORMAL HIGH (ref 11.5–14.7)

## 2013-04-05 LAB — CBC CANCER CENTER
Eosinophil #: 0.1 x10 3/mm (ref 0.0–0.7)
Eosinophil %: 14.5 %
HCT: 32.5 % — ABNORMAL LOW (ref 35.0–47.0)
HGB: 10.6 g/dL — ABNORMAL LOW (ref 12.0–16.0)
Lymphocyte #: 0.3 x10 3/mm — ABNORMAL LOW (ref 1.0–3.6)
MCH: 29.5 pg (ref 26.0–34.0)
MCHC: 32.6 g/dL (ref 32.0–36.0)
Neutrophil #: 0.4 x10 3/mm — ABNORMAL LOW (ref 1.4–6.5)
Neutrophil %: 41.7 %
Platelet: 222 x10 3/mm (ref 150–440)
RDW: 20.6 % — ABNORMAL HIGH (ref 11.5–14.5)

## 2013-04-05 LAB — PROTIME-INR: Prothrombin Time: 22.1 secs — ABNORMAL HIGH (ref 11.5–14.7)

## 2013-04-13 LAB — CBC CANCER CENTER
Basophil %: 0.6 %
Eosinophil #: 0.1 x10 3/mm (ref 0.0–0.7)
Eosinophil %: 1.5 %
HCT: 30 % — ABNORMAL LOW (ref 35.0–47.0)
HGB: 9.9 g/dL — ABNORMAL LOW (ref 12.0–16.0)
Lymphocyte #: 0.3 x10 3/mm — ABNORMAL LOW (ref 1.0–3.6)
Lymphocyte %: 6.2 %
MCH: 30.1 pg (ref 26.0–34.0)
MCHC: 32.9 g/dL (ref 32.0–36.0)
MCV: 91 fL (ref 80–100)
Monocyte %: 5.2 %
Neutrophil #: 4.3 x10 3/mm (ref 1.4–6.5)
Neutrophil %: 86.5 %
RBC: 3.28 10*6/uL — ABNORMAL LOW (ref 3.80–5.20)
RDW: 21.4 % — ABNORMAL HIGH (ref 11.5–14.5)
WBC: 4.9 x10 3/mm (ref 3.6–11.0)

## 2013-04-13 LAB — COMPREHENSIVE METABOLIC PANEL
Albumin: 2.7 g/dL — ABNORMAL LOW (ref 3.4–5.0)
BUN: 17 mg/dL (ref 7–18)
Creatinine: 1.46 mg/dL — ABNORMAL HIGH (ref 0.60–1.30)
EGFR (African American): 45 — ABNORMAL LOW
EGFR (Non-African Amer.): 38 — ABNORMAL LOW
Glucose: 122 mg/dL — ABNORMAL HIGH (ref 65–99)
Osmolality: 264 (ref 275–301)
SGOT(AST): 20 U/L (ref 15–37)
SGPT (ALT): 27 U/L (ref 12–78)
Sodium: 130 mmol/L — ABNORMAL LOW (ref 136–145)
Total Protein: 6.7 g/dL (ref 6.4–8.2)

## 2013-04-14 ENCOUNTER — Encounter: Payer: Self-pay | Admitting: General Surgery

## 2013-04-14 ENCOUNTER — Ambulatory Visit (INDEPENDENT_AMBULATORY_CARE_PROVIDER_SITE_OTHER): Payer: Medicare Other | Admitting: General Surgery

## 2013-04-14 VITALS — BP 112/70 | HR 88 | Resp 16 | Ht 64.0 in | Wt 138.0 lb

## 2013-04-14 DIAGNOSIS — I872 Venous insufficiency (chronic) (peripheral): Secondary | ICD-10-CM

## 2013-04-14 DIAGNOSIS — R609 Edema, unspecified: Secondary | ICD-10-CM

## 2013-04-14 DIAGNOSIS — R600 Localized edema: Secondary | ICD-10-CM

## 2013-04-14 NOTE — Patient Instructions (Addendum)
Patient to continued to wear compression hose

## 2013-04-14 NOTE — Progress Notes (Signed)
Patient ID: Wanda Mann, female   DOB: December 18, 1950, 62 y.o.   MRN: [id number]  Chief Complaint  Patient presents with  . Follow-up    3 week follow up leg edema    HPI Wanda Mann is a 62 y.o. female who presents for a 3 week follow up for lower left leg edema. She is wearing  compression hose daily. Some reduction of leg swelling. HPI  Past Medical History  Diagnosis Date  . Stomach ulcer   . Cancer 2010    ovarian cancer     Past Surgical History  Procedure Laterality Date  . Colonoscopy  2010    Dr.Elliott  . Abdominal hysterectomy  1981  . Port a cath revision  2010  . Upper gi endoscopy  2010  . Laparoscopy  2010  . Abdominal surgery  2013  . Ovary surgery  2010    History reviewed. No pertinent family history.  Social History History  Substance Use Topics  . Smoking status: Never Smoker   . Smokeless tobacco: Never Used  . Alcohol Use: No    Allergies  Allergen Reactions  . Penicillins Hives    Current Outpatient Prescriptions  Medication Sig Dispense Refill  . alendronate (FOSAMAX) 70 MG tablet Take 70 mg by mouth every 7 (seven) days. Take with a full glass of water on an empty stomach.      Marland Kitchen amitriptyline (ELAVIL) 25 MG tablet Take 25 mg by mouth at bedtime.      . DULoxetine (CYMBALTA) 60 MG capsule Take 60 mg by mouth daily.      Marland Kitchen estrogens, conjugated, (PREMARIN) 0.625 MG tablet Take 0.625 mg by mouth daily. Take daily for 21 days then do not take for 7 days.      . fentaNYL (DURAGESIC - DOSED MCG/HR) 100 MCG/HR Place 2 patches onto the skin every 3 (three) days.      . fentaNYL (DURAGESIC - DOSED MCG/HR) 75 MCG/HR Place 1 patch onto the skin every 3 (three) days.      . furosemide (LASIX) 40 MG tablet Take 40 mg by mouth daily.      Marland Kitchen HYDROcodone-acetaminophen (NORCO/VICODIN) 5-325 MG per tablet       . levofloxacin (LEVAQUIN) 500 MG tablet Take 500 mg by mouth daily.      Marland Kitchen levothyroxine (SYNTHROID, LEVOTHROID) 50 MCG tablet Take 50 mcg by  mouth daily before breakfast.      . omeprazole (PRILOSEC) 20 MG capsule Take 20 mg by mouth daily.      . ondansetron (ZOFRAN) 4 MG tablet Take 4 mg by mouth every 8 (eight) hours as needed for nausea.      . prednisoLONE 5 MG TABS Take 5 mg by mouth 2 (two) times daily.      . promethazine (PHENERGAN) 25 MG tablet Take 25 mg by mouth every 6 (six) hours as needed for nausea.      Marland Kitchen rOPINIRole (REQUIP) 0.5 MG tablet Take 0.5 mg by mouth 3 (three) times daily.      . traMADol (ULTRAM) 50 MG tablet Take 50 mg by mouth every 6 (six) hours as needed for pain.      . valACYclovir (VALTREX) 1000 MG tablet Take 1,000 mg by mouth 2 (two) times daily.      Marland Kitchen warfarin (COUMADIN) 5 MG tablet Take 5 mg by mouth daily.      Marland Kitchen zolpidem (AMBIEN) 10 MG tablet Take 10 mg by mouth at bedtime as needed  for sleep.       No current facility-administered medications for this visit.    Review of Systems Review of Systems  Constitutional: Negative.   Cardiovascular: Negative for chest pain and palpitations.    Blood pressure 112/70, pulse 88, resp. rate 16, height 5\' 4"  (1.626 m), weight 138 lb (62.596 kg).  Physical Exam Physical Exam  Left leg shows decreases in edema , no skin changes noted, no redness or streaks   Data Reviewed  none  Assessment    DVT left PV, venous insufficiency. Pt started on coumadin by Dr. Doylene Canning.    Plan    Continue to wear  compression hose daily follow up PRN       Mashal Slavick G 04/15/2013, 9:28 AM

## 2013-04-15 ENCOUNTER — Telehealth: Payer: Self-pay | Admitting: *Deleted

## 2013-04-15 ENCOUNTER — Encounter: Payer: Self-pay | Admitting: General Surgery

## 2013-04-15 DIAGNOSIS — I872 Venous insufficiency (chronic) (peripheral): Secondary | ICD-10-CM | POA: Insufficient documentation

## 2013-04-15 NOTE — Telephone Encounter (Signed)
I talked with [facility] about a lymphedema pump for the patient.  The patient will call [facility] as well with more details to see if insurance will be covered.  She is aware that notes from [place] and Dr Doylene Canning may be needed. If pt qualifies then we will send a RX to [facility].

## 2013-04-18 ENCOUNTER — Ambulatory Visit: Payer: Self-pay | Admitting: Gynecologic Oncology

## 2013-05-05 LAB — COMPREHENSIVE METABOLIC PANEL
Albumin: 3.2 g/dL — ABNORMAL LOW (ref 3.4–5.0)
Anion Gap: 2 — ABNORMAL LOW (ref 7–16)
BUN: 24 mg/dL — ABNORMAL HIGH (ref 7–18)
Bilirubin,Total: 0.3 mg/dL (ref 0.2–1.0)
Chloride: 95 mmol/L — ABNORMAL LOW (ref 98–107)
Creatinine: 1.45 mg/dL — ABNORMAL HIGH (ref 0.60–1.30)
EGFR (African American): 45 — ABNORMAL LOW
EGFR (Non-African Amer.): 38 — ABNORMAL LOW
Osmolality: 272 (ref 275–301)
Potassium: 3.5 mmol/L (ref 3.5–5.1)
SGOT(AST): 37 U/L (ref 15–37)
Sodium: 133 mmol/L — ABNORMAL LOW (ref 136–145)
Total Protein: 7 g/dL (ref 6.4–8.2)

## 2013-05-05 LAB — CBC CANCER CENTER
Basophil #: 0.2 x10 3/mm — ABNORMAL HIGH (ref 0.0–0.1)
Eosinophil #: 0.1 x10 3/mm (ref 0.0–0.7)
Eosinophil %: 1.2 %
HCT: 32.7 % — ABNORMAL LOW (ref 35.0–47.0)
HGB: 10.9 g/dL — ABNORMAL LOW (ref 12.0–16.0)
Lymphocyte #: 0.3 x10 3/mm — ABNORMAL LOW (ref 1.0–3.6)
MCH: 31.2 pg (ref 26.0–34.0)
MCHC: 33.4 g/dL (ref 32.0–36.0)
MCV: 94 fL (ref 80–100)
Monocyte %: 7.9 %
Neutrophil #: 3.6 x10 3/mm (ref 1.4–6.5)
Neutrophil %: 79.9 %
Platelet: 373 x10 3/mm (ref 150–440)
RDW: 20.5 % — ABNORMAL HIGH (ref 11.5–14.5)

## 2013-05-05 LAB — PROTIME-INR: Prothrombin Time: 33.1 secs — ABNORMAL HIGH (ref 11.5–14.7)

## 2013-05-12 ENCOUNTER — Inpatient Hospital Stay: Payer: Self-pay | Admitting: Internal Medicine

## 2013-05-12 DIAGNOSIS — K315 Obstruction of duodenum: Secondary | ICD-10-CM

## 2013-05-12 LAB — URINALYSIS, COMPLETE
Bilirubin,UR: NEGATIVE
Blood: NEGATIVE
Glucose,UR: NEGATIVE mg/dL (ref 0–75)
Ketone: NEGATIVE
Leukocyte Esterase: NEGATIVE
Nitrite: NEGATIVE
Ph: 8 (ref 4.5–8.0)
Protein: 30
Squamous Epithelial: 3

## 2013-05-12 LAB — COMPREHENSIVE METABOLIC PANEL
Albumin: 2.5 g/dL — ABNORMAL LOW (ref 3.4–5.0)
Anion Gap: 4 — ABNORMAL LOW (ref 7–16)
BUN: 24 mg/dL — ABNORMAL HIGH (ref 7–18)
Bilirubin,Total: 0.4 mg/dL (ref 0.2–1.0)
Calcium, Total: 8.3 mg/dL — ABNORMAL LOW (ref 8.5–10.1)
Co2: 42 mmol/L (ref 21–32)
Creatinine: 1.26 mg/dL (ref 0.60–1.30)
EGFR (African American): 53 — ABNORMAL LOW
EGFR (Non-African Amer.): 46 — ABNORMAL LOW
Glucose: 121 mg/dL — ABNORMAL HIGH (ref 65–99)
Osmolality: 274 (ref 275–301)
Potassium: 2.9 mmol/L — ABNORMAL LOW (ref 3.5–5.1)
SGOT(AST): 24 U/L (ref 15–37)
SGPT (ALT): 28 U/L (ref 12–78)

## 2013-05-12 LAB — PROTIME-INR
INR: 4.9
Prothrombin Time: 43.5 secs — ABNORMAL HIGH (ref 11.5–14.7)

## 2013-05-12 LAB — CBC
HGB: 9.2 g/dL — ABNORMAL LOW (ref 12.0–16.0)
MCH: 31 pg (ref 26.0–34.0)
Platelet: 235 10*3/uL (ref 150–440)
RBC: 2.98 10*6/uL — ABNORMAL LOW (ref 3.80–5.20)
RDW: 19.8 % — ABNORMAL HIGH (ref 11.5–14.5)
WBC: 5.3 10*3/uL (ref 3.6–11.0)

## 2013-05-13 ENCOUNTER — Encounter: Payer: Self-pay | Admitting: General Surgery

## 2013-05-13 LAB — BASIC METABOLIC PANEL
Anion Gap: 4 — ABNORMAL LOW (ref 7–16)
BUN: 16 mg/dL (ref 7–18)
Calcium, Total: 7.2 mg/dL — ABNORMAL LOW (ref 8.5–10.1)
Chloride: 103 mmol/L (ref 98–107)
Co2: 32 mmol/L (ref 21–32)
Creatinine: 1 mg/dL (ref 0.60–1.30)
EGFR (African American): >60
EGFR (Non-African Amer.): >60
Glucose: 87 mg/dL (ref 65–99)
Potassium: 3.7 mmol/L (ref 3.5–5.1)
Sodium: 139 mmol/L (ref 136–145)

## 2013-05-13 LAB — CBC WITH DIFFERENTIAL/PLATELET
Basophil #: 0 10*3/uL (ref 0.0–0.1)
Basophil %: 1.8 %
Eosinophil #: 0.1 10*3/uL (ref 0.0–0.7)
HGB: 7.4 g/dL — ABNORMAL LOW (ref 12.0–16.0)
Lymphocyte %: 9.8 %
MCH: 31.3 pg (ref 26.0–34.0)
Monocyte %: 7.7 %
WBC: 1.5 10*3/uL — CL (ref 3.6–11.0)

## 2013-05-13 LAB — CA 125: CA 125: 666.9 U/mL — ABNORMAL HIGH (ref 0.0–34.0)

## 2013-05-14 LAB — BASIC METABOLIC PANEL
Anion Gap: 7 (ref 7–16)
BUN: 16 mg/dL (ref 7–18)
Chloride: 107 mmol/L (ref 98–107)
Co2: 24 mmol/L (ref 21–32)
Creatinine: 0.8 mg/dL (ref 0.60–1.30)
EGFR (African American): >60
EGFR (Non-African Amer.): >60
Glucose: 47 mg/dL — ABNORMAL LOW (ref 65–99)
Osmolality: 274 (ref 275–301)
Potassium: 4.7 mmol/L (ref 3.5–5.1)
Sodium: 138 mmol/L (ref 136–145)

## 2013-05-14 LAB — CBC WITH DIFFERENTIAL/PLATELET
Basophil #: 0 10*3/uL (ref 0.0–0.1)
Basophil %: 3.5 %
Eosinophil #: 0.1 10*3/uL (ref 0.0–0.7)
HCT: 23.1 % — ABNORMAL LOW (ref 35.0–47.0)
HGB: 7.6 g/dL — ABNORMAL LOW (ref 12.0–16.0)
Lymphocyte #: 0.2 10*3/uL — ABNORMAL LOW (ref 1.0–3.6)
Lymphocyte %: 16.4 %
MCH: 31.1 pg (ref 26.0–34.0)
MCHC: 33 g/dL (ref 32.0–36.0)
MCV: 94 fL (ref 80–100)
Monocyte #: 0.1 x10 3/mm — ABNORMAL LOW (ref 0.2–0.9)
Neutrophil #: 0.5 10*3/uL — ABNORMAL LOW (ref 1.4–6.5)
Neutrophil %: 55.9 %
Platelet: 188 10*3/uL (ref 150–440)
RBC: 2.46 10*6/uL — ABNORMAL LOW (ref 3.80–5.20)
RDW: 20 % — ABNORMAL HIGH (ref 11.5–14.5)

## 2013-05-14 LAB — PROTIME-INR: Prothrombin Time: 40.8 secs — ABNORMAL HIGH (ref 11.5–14.7)

## 2013-05-15 LAB — CBC WITH DIFFERENTIAL/PLATELET
Eosinophil #: 0.1 10*3/uL (ref 0.0–0.7)
HCT: 22.9 % — ABNORMAL LOW (ref 35.0–47.0)
HGB: 7.7 g/dL — ABNORMAL LOW (ref 12.0–16.0)
Lymphocyte #: 0.1 10*3/uL — ABNORMAL LOW (ref 1.0–3.6)
Lymphocyte %: 10.2 %
MCH: 31.5 pg (ref 26.0–34.0)
MCHC: 33.7 g/dL (ref 32.0–36.0)
Monocyte %: 20.7 %
Neutrophil #: 0.8 10*3/uL — ABNORMAL LOW (ref 1.4–6.5)
Neutrophil %: 58.1 %
RDW: 19.2 % — ABNORMAL HIGH (ref 11.5–14.5)

## 2013-05-16 LAB — BASIC METABOLIC PANEL
Anion Gap: 6 — ABNORMAL LOW (ref 7–16)
BUN: 6 mg/dL — ABNORMAL LOW (ref 7–18)
Chloride: 102 mmol/L (ref 98–107)
Co2: 26 mmol/L (ref 21–32)
Creatinine: 0.89 mg/dL (ref 0.60–1.30)
EGFR (Non-African Amer.): >60
Osmolality: 265 (ref 275–301)
Potassium: 4.3 mmol/L (ref 3.5–5.1)
Sodium: 134 mmol/L — ABNORMAL LOW (ref 136–145)

## 2013-05-16 LAB — CBC WITH DIFFERENTIAL/PLATELET
Basophil #: 0 10*3/uL (ref 0.0–0.1)
Basophil %: 1 %
Eosinophil #: 0.1 10*3/uL (ref 0.0–0.7)
Eosinophil %: 5.6 %
HGB: 8.4 g/dL — ABNORMAL LOW (ref 12.0–16.0)
MCH: 31.7 pg (ref 26.0–34.0)
MCHC: 34.3 g/dL (ref 32.0–36.0)
MCV: 93 fL (ref 80–100)
Monocyte %: 15.3 %
Neutrophil #: 1.4 10*3/uL (ref 1.4–6.5)
Neutrophil %: 67.3 %
Platelet: 256 10*3/uL (ref 150–440)
RBC: 2.64 10*6/uL — ABNORMAL LOW (ref 3.80–5.20)
RDW: 19.2 % — ABNORMAL HIGH (ref 11.5–14.5)
WBC: 2.1 10*3/uL — ABNORMAL LOW (ref 3.6–11.0)

## 2013-05-16 LAB — PROTIME-INR: Prothrombin Time: 45.2 secs — ABNORMAL HIGH (ref 11.5–14.7)

## 2013-05-17 LAB — CBC WITH DIFFERENTIAL/PLATELET
Basophil #: 0.1 10*3/uL (ref 0.0–0.1)
Basophil %: 1.4 %
Eosinophil #: 0.2 10*3/uL (ref 0.0–0.7)
Eosinophil %: 4.1 %
HCT: 27.4 % — ABNORMAL LOW (ref 35.0–47.0)
Lymphocyte #: 0.4 10*3/uL — ABNORMAL LOW (ref 1.0–3.6)
MCH: 30.7 pg (ref 26.0–34.0)
MCHC: 32.8 g/dL (ref 32.0–36.0)
Monocyte #: 0.4 x10 3/mm (ref 0.2–0.9)
Monocyte %: 9 %
Neutrophil %: 76.1 %
Platelet: 322 10*3/uL (ref 150–440)
RBC: 2.93 10*6/uL — ABNORMAL LOW (ref 3.80–5.20)
RDW: 19.5 % — ABNORMAL HIGH (ref 11.5–14.5)
WBC: 4.1 10*3/uL (ref 3.6–11.0)

## 2013-05-17 LAB — PROTIME-INR: INR: 4.1

## 2013-05-17 LAB — MAGNESIUM: Magnesium: 2.1 mg/dL

## 2013-05-18 ENCOUNTER — Ambulatory Visit: Payer: Self-pay | Admitting: Gynecologic Oncology

## 2013-05-19 LAB — CBC WITH DIFFERENTIAL/PLATELET
Basophil: 1 %
Eosinophil: 1 %
HCT: 33.9 % — ABNORMAL LOW (ref 35.0–47.0)
HGB: 11.1 g/dL — ABNORMAL LOW (ref 12.0–16.0)
Lymphocytes: 7 %
MCHC: 32.8 g/dL (ref 32.0–36.0)
Metamyelocyte: 1 %
Platelet: 485 10*3/uL — ABNORMAL HIGH (ref 150–440)
RBC: 3.69 10*6/uL — ABNORMAL LOW (ref 3.80–5.20)
RDW: 19.2 % — ABNORMAL HIGH (ref 11.5–14.5)
Variant Lymphocyte - H1-Rlymph: 2 %

## 2013-05-19 LAB — CBC CANCER CENTER
Basophil #: 0.1 x10 3/mm (ref 0.0–0.1)
Basophil %: 1.1 %
Eosinophil #: 0.1 x10 3/mm (ref 0.0–0.7)
HCT: 31.3 % — ABNORMAL LOW (ref 35.0–47.0)
Lymphocyte #: 0.6 x10 3/mm — ABNORMAL LOW (ref 1.0–3.6)
Lymphocyte %: 9 %
MCV: 92 fL (ref 80–100)
Monocyte #: 1.1 x10 3/mm — ABNORMAL HIGH (ref 0.2–0.9)
Monocyte %: 16.7 %
Neutrophil #: 4.6 x10 3/mm (ref 1.4–6.5)
Neutrophil %: 70.9 %
Platelet: 448 x10 3/mm — ABNORMAL HIGH (ref 150–440)
RDW: 18.8 % — ABNORMAL HIGH (ref 11.5–14.5)
WBC: 6.5 x10 3/mm (ref 3.6–11.0)

## 2013-05-19 LAB — COMPREHENSIVE METABOLIC PANEL
Alkaline Phosphatase: 387 U/L — ABNORMAL HIGH (ref 50–136)
BUN: 12 mg/dL (ref 7–18)
Bilirubin,Total: 0.6 mg/dL (ref 0.2–1.0)
Chloride: 91 mmol/L — ABNORMAL LOW (ref 98–107)
Co2: 33 mmol/L — ABNORMAL HIGH (ref 21–32)
EGFR (African American): 60 — ABNORMAL LOW
EGFR (Non-African Amer.): 51 — ABNORMAL LOW
Glucose: 120 mg/dL — ABNORMAL HIGH (ref 65–99)
Osmolality: 267 (ref 275–301)
Potassium: 3.3 mmol/L — ABNORMAL LOW (ref 3.5–5.1)
Sodium: 133 mmol/L — ABNORMAL LOW (ref 136–145)

## 2013-05-19 LAB — PROTIME-INR
INR: 2.1
Prothrombin Time: 22.9 secs — ABNORMAL HIGH (ref 11.5–14.7)

## 2013-05-20 ENCOUNTER — Inpatient Hospital Stay: Payer: Self-pay | Admitting: Internal Medicine

## 2013-05-20 DIAGNOSIS — K565 Intestinal adhesions [bands], unspecified as to partial versus complete obstruction: Secondary | ICD-10-CM

## 2013-05-21 LAB — CBC WITH DIFFERENTIAL/PLATELET
Bands: 8 %
Eosinophil: 2 %
HCT: 26.2 % — ABNORMAL LOW (ref 35.0–47.0)
HGB: 8.7 g/dL — ABNORMAL LOW (ref 12.0–16.0)
Lymphocytes: 17 %
MCH: 30.3 pg (ref 26.0–34.0)
MCV: 92 fL (ref 80–100)
Metamyelocyte: 1 %
Myelocyte: 1 %
Platelet: 390 10*3/uL (ref 150–440)
RBC: 2.86 10*6/uL — ABNORMAL LOW (ref 3.80–5.20)

## 2013-05-21 LAB — BASIC METABOLIC PANEL
BUN: 11 mg/dL (ref 7–18)
Co2: 26 mmol/L (ref 21–32)
EGFR (African American): >60
Glucose: 69 mg/dL (ref 65–99)
Osmolality: 266 (ref 275–301)
Sodium: 134 mmol/L — ABNORMAL LOW (ref 136–145)

## 2013-05-21 LAB — PROTIME-INR: INR: 2.5

## 2013-05-23 LAB — PROTIME-INR
INR: 2.5
Prothrombin Time: 26.5 secs — ABNORMAL HIGH (ref 11.5–14.7)

## 2013-05-24 LAB — PROTIME-INR: INR: 2.7

## 2013-05-24 LAB — CREATININE, SERUM
Creatinine: 0.89 mg/dL (ref 0.60–1.30)
EGFR (African American): >60

## 2013-05-25 LAB — PROTIME-INR
INR: 1.5
Prothrombin Time: 18.1 secs — ABNORMAL HIGH (ref 11.5–14.7)

## 2013-05-25 LAB — VANCOMYCIN, TROUGH: Vancomycin, Trough: 15 ug/mL (ref 10–20)

## 2013-05-25 LAB — HEMOGLOBIN: HGB: 10 g/dL — ABNORMAL LOW (ref 12.0–16.0)

## 2013-05-26 ENCOUNTER — Encounter: Payer: Self-pay | Admitting: General Surgery

## 2013-05-26 LAB — CBC WITH DIFFERENTIAL/PLATELET
Basophil %: 0.4 %
Eosinophil %: 2.5 %
HCT: 30.9 % — ABNORMAL LOW (ref 35.0–47.0)
HGB: 10.1 g/dL — ABNORMAL LOW (ref 12.0–16.0)
Lymphocyte #: 0.9 10*3/uL — ABNORMAL LOW (ref 1.0–3.6)
MCHC: 32.8 g/dL (ref 32.0–36.0)
MCV: 91 fL (ref 80–100)
Monocyte %: 3.8 %
Neutrophil #: 12.5 10*3/uL — ABNORMAL HIGH (ref 1.4–6.5)
RDW: 18.2 % — ABNORMAL HIGH (ref 11.5–14.5)
WBC: 14.3 10*3/uL — ABNORMAL HIGH (ref 3.6–11.0)

## 2013-05-26 LAB — COMPREHENSIVE METABOLIC PANEL
Albumin: 1.8 g/dL — ABNORMAL LOW (ref 3.4–5.0)
Alkaline Phosphatase: 187 U/L — ABNORMAL HIGH (ref 50–136)
Bilirubin,Total: 0.3 mg/dL (ref 0.2–1.0)
Calcium, Total: 6.4 mg/dL — CL (ref 8.5–10.1)
Chloride: 104 mmol/L (ref 98–107)
Co2: 21 mmol/L (ref 21–32)
Osmolality: 269 (ref 275–301)
Potassium: 3.8 mmol/L (ref 3.5–5.1)
SGOT(AST): 26 U/L (ref 15–37)
SGPT (ALT): 21 U/L (ref 12–78)
Sodium: 134 mmol/L — ABNORMAL LOW (ref 136–145)
Total Protein: 4.5 g/dL — ABNORMAL LOW (ref 6.4–8.2)

## 2013-05-26 LAB — PROTIME-INR
INR: 1.1
Prothrombin Time: 14.2 secs (ref 11.5–14.7)

## 2013-05-26 LAB — CREATININE, SERUM: EGFR (African American): >60

## 2013-05-27 LAB — CBC WITH DIFFERENTIAL/PLATELET
Basophil %: 0.3 %
Eosinophil %: 3.5 %
HCT: 29.8 % — ABNORMAL LOW (ref 35.0–47.0)
HGB: 9.1 g/dL — ABNORMAL LOW (ref 12.0–16.0)
MCH: 28.1 pg (ref 26.0–34.0)
MCHC: 30.4 g/dL — ABNORMAL LOW (ref 32.0–36.0)
MCV: 93 fL (ref 80–100)
Monocyte #: 0.5 x10 3/mm (ref 0.2–0.9)
Neutrophil #: 11.8 10*3/uL — ABNORMAL HIGH (ref 1.4–6.5)
Neutrophil %: 84.8 %
Platelet: 292 10*3/uL (ref 150–440)
RBC: 3.22 10*6/uL — ABNORMAL LOW (ref 3.80–5.20)
WBC: 13.9 10*3/uL — ABNORMAL HIGH (ref 3.6–11.0)

## 2013-05-27 LAB — BASIC METABOLIC PANEL
Chloride: 104 mmol/L (ref 98–107)
EGFR (Non-African Amer.): 58 — ABNORMAL LOW
Glucose: 80 mg/dL (ref 65–99)
Osmolality: 265 (ref 275–301)
Potassium: 3.6 mmol/L (ref 3.5–5.1)

## 2013-05-28 LAB — PROTIME-INR
INR: 1.2
Prothrombin Time: 14.9 secs — ABNORMAL HIGH (ref 11.5–14.7)

## 2013-05-29 LAB — CREATININE, SERUM
Creatinine: 0.84 mg/dL (ref 0.60–1.30)
EGFR (African American): >60

## 2013-05-29 LAB — PROTIME-INR
INR: 1.4
Prothrombin Time: 17.1 secs — ABNORMAL HIGH (ref 11.5–14.7)

## 2013-06-02 ENCOUNTER — Encounter: Payer: Self-pay | Admitting: *Deleted

## 2013-06-02 ENCOUNTER — Ambulatory Visit (INDEPENDENT_AMBULATORY_CARE_PROVIDER_SITE_OTHER): Payer: Medicare Other | Admitting: *Deleted

## 2013-06-02 DIAGNOSIS — I872 Venous insufficiency (chronic) (peripheral): Secondary | ICD-10-CM

## 2013-06-02 NOTE — Progress Notes (Signed)
The staples were removed and steri strips applied. 

## 2013-06-03 ENCOUNTER — Ambulatory Visit: Payer: Self-pay | Admitting: Gynecologic Oncology

## 2013-06-03 LAB — CBC CANCER CENTER
Eosinophil %: 7.8 %
HCT: 30.8 % — ABNORMAL LOW (ref 35.0–47.0)
HGB: 9.9 g/dL — ABNORMAL LOW (ref 12.0–16.0)
MCH: 29.1 pg (ref 26.0–34.0)
MCHC: 32.2 g/dL (ref 32.0–36.0)
MCV: 90 fL (ref 80–100)
Neutrophil %: 80.3 %
Platelet: 308 x10 3/mm (ref 150–440)
RBC: 3.41 10*6/uL — ABNORMAL LOW (ref 3.80–5.20)
WBC: 8.7 x10 3/mm (ref 3.6–11.0)

## 2013-06-03 LAB — COMPREHENSIVE METABOLIC PANEL
Alkaline Phosphatase: 240 U/L — ABNORMAL HIGH (ref 50–136)
Anion Gap: 5 — ABNORMAL LOW (ref 7–16)
BUN: 13 mg/dL (ref 7–18)
Bilirubin,Total: 0.5 mg/dL (ref 0.2–1.0)
Calcium, Total: 7.4 mg/dL — ABNORMAL LOW (ref 8.5–10.1)
Co2: 35 mmol/L — ABNORMAL HIGH (ref 21–32)
Creatinine: 1.06 mg/dL (ref 0.60–1.30)
EGFR (African American): >60
EGFR (Non-African Amer.): 56 — ABNORMAL LOW
Osmolality: 261 (ref 275–301)
SGOT(AST): 16 U/L (ref 15–37)
Total Protein: 6.3 g/dL — ABNORMAL LOW (ref 6.4–8.2)

## 2013-06-03 LAB — IRON AND TIBC
Iron Bind.Cap.(Total): 189 ug/dL — ABNORMAL LOW (ref 250–450)
Iron: 19 ug/dL — ABNORMAL LOW (ref 50–170)
Unbound Iron-Bind.Cap.: 170 ug/dL

## 2013-06-03 LAB — FERRITIN: Ferritin (ARMC): 349 ng/mL (ref 8–388)

## 2013-06-09 ENCOUNTER — Inpatient Hospital Stay: Payer: Self-pay | Admitting: General Surgery

## 2013-06-09 LAB — CBC WITH DIFFERENTIAL/PLATELET
Basophil %: 0.4 %
Eosinophil #: 0.3 10*3/uL (ref 0.0–0.7)
HGB: 8.6 g/dL — ABNORMAL LOW (ref 12.0–16.0)
Lymphocyte %: 8.7 %
MCH: 29 pg (ref 26.0–34.0)
Monocyte %: 5.9 %
Neutrophil %: 79.7 %
Platelet: 261 10*3/uL (ref 150–440)
RBC: 2.96 10*6/uL — ABNORMAL LOW (ref 3.80–5.20)
RDW: 18.8 % — ABNORMAL HIGH (ref 11.5–14.5)
WBC: 6.6 10*3/uL (ref 3.6–11.0)

## 2013-06-09 LAB — COMPREHENSIVE METABOLIC PANEL
Albumin: 2.1 g/dL — ABNORMAL LOW (ref 3.4–5.0)
Alkaline Phosphatase: 281 U/L — ABNORMAL HIGH (ref 50–136)
BUN: 20 mg/dL — ABNORMAL HIGH (ref 7–18)
Bilirubin,Total: 0.5 mg/dL (ref 0.2–1.0)
Co2: 38 mmol/L — ABNORMAL HIGH (ref 21–32)
Creatinine: 1.42 mg/dL — ABNORMAL HIGH (ref 0.60–1.30)
EGFR (African American): 46 — ABNORMAL LOW
EGFR (Non-African Amer.): 39 — ABNORMAL LOW
Osmolality: 258 (ref 275–301)
SGOT(AST): 16 U/L (ref 15–37)
Sodium: 127 mmol/L — ABNORMAL LOW (ref 136–145)
Total Protein: 5.4 g/dL — ABNORMAL LOW (ref 6.4–8.2)

## 2013-06-09 LAB — PROTIME-INR
INR: 3
Prothrombin Time: 30.1 secs — ABNORMAL HIGH (ref 11.5–14.7)

## 2013-06-09 LAB — APTT: Activated PTT: 122.2 secs — ABNORMAL HIGH (ref 23.6–35.9)

## 2013-06-09 LAB — TROPONIN I: Troponin-I: <0.02 ng/mL

## 2013-06-10 LAB — BASIC METABOLIC PANEL
Chloride: 91 mmol/L — ABNORMAL LOW (ref 98–107)
EGFR (African American): 54 — ABNORMAL LOW
EGFR (Non-African Amer.): 47 — ABNORMAL LOW
Glucose: 115 mg/dL — ABNORMAL HIGH (ref 65–99)
Potassium: 3.7 mmol/L (ref 3.5–5.1)
Sodium: 132 mmol/L — ABNORMAL LOW (ref 136–145)

## 2013-06-10 LAB — URINALYSIS, COMPLETE
Bilirubin,UR: NEGATIVE
Glucose,UR: NEGATIVE mg/dL (ref 0–75)
Hyaline Cast: 38
Ketone: NEGATIVE
Leukocyte Esterase: NEGATIVE
Ph: 8 (ref 4.5–8.0)
Protein: 30
RBC,UR: 4 /HPF (ref 0–5)
WBC UR: 8 /HPF (ref 0–5)

## 2013-06-10 LAB — CBC WITH DIFFERENTIAL/PLATELET
Basophil #: 0 10*3/uL (ref 0.0–0.1)
Basophil %: 0.7 %
Eosinophil #: 0.7 10*3/uL (ref 0.0–0.7)
HGB: 7.7 g/dL — ABNORMAL LOW (ref 12.0–16.0)
Lymphocyte %: 8.6 %
MCHC: 32.5 g/dL (ref 32.0–36.0)
Monocyte #: 0.3 x10 3/mm (ref 0.2–0.9)
Neutrophil #: 3.7 10*3/uL (ref 1.4–6.5)
Neutrophil %: 70.8 %
Platelet: 216 10*3/uL (ref 150–440)
RBC: 2.68 10*6/uL — ABNORMAL LOW (ref 3.80–5.20)

## 2013-06-10 LAB — PROTIME-INR
INR: 3.2
Prothrombin Time: 31.5 secs — ABNORMAL HIGH (ref 11.5–14.7)

## 2013-06-11 LAB — BASIC METABOLIC PANEL
Anion Gap: 2 — ABNORMAL LOW (ref 7–16)
BUN: 18 mg/dL (ref 7–18)
Calcium, Total: 6.7 mg/dL — CL (ref 8.5–10.1)
Chloride: 97 mmol/L — ABNORMAL LOW (ref 98–107)
Co2: 34 mmol/L — ABNORMAL HIGH (ref 21–32)
EGFR (African American): 55 — ABNORMAL LOW
Glucose: 83 mg/dL (ref 65–99)
Osmolality: 267 (ref 275–301)
Potassium: 4.3 mmol/L (ref 3.5–5.1)

## 2013-06-11 LAB — CBC WITH DIFFERENTIAL/PLATELET
Basophil #: 0.1 10*3/uL (ref 0.0–0.1)
Basophil %: 1.4 %
Eosinophil #: 0.8 10*3/uL — ABNORMAL HIGH (ref 0.0–0.7)
Eosinophil %: 17.7 %
HCT: 24.1 % — ABNORMAL LOW (ref 35.0–47.0)
Lymphocyte #: 0.5 10*3/uL — ABNORMAL LOW (ref 1.0–3.6)
Lymphocyte %: 11.5 %
Monocyte #: 0.4 x10 3/mm (ref 0.2–0.9)
Monocyte %: 7.4 %
RBC: 2.7 10*6/uL — ABNORMAL LOW (ref 3.80–5.20)
RDW: 18.7 % — ABNORMAL HIGH (ref 11.5–14.5)
WBC: 4.8 10*3/uL (ref 3.6–11.0)

## 2013-06-12 ENCOUNTER — Emergency Department: Payer: Self-pay | Admitting: Emergency Medicine

## 2013-06-12 LAB — URINALYSIS, COMPLETE
Bilirubin,UR: NEGATIVE
Leukocyte Esterase: NEGATIVE
Nitrite: NEGATIVE
Ph: 7 (ref 4.5–8.0)
Protein: 30
RBC,UR: 4 /HPF (ref 0–5)
Specific Gravity: 1.019 (ref 1.003–1.030)
Squamous Epithelial: <1
WBC UR: 2 /HPF (ref 0–5)

## 2013-06-12 LAB — COMPREHENSIVE METABOLIC PANEL
Albumin: 1.8 g/dL — ABNORMAL LOW (ref 3.4–5.0)
Alkaline Phosphatase: 522 U/L — ABNORMAL HIGH (ref 50–136)
Anion Gap: 5 — ABNORMAL LOW (ref 7–16)
BUN: 21 mg/dL — ABNORMAL HIGH (ref 7–18)
Calcium, Total: 6.6 mg/dL — CL (ref 8.5–10.1)
Chloride: 97 mmol/L — ABNORMAL LOW (ref 98–107)
EGFR (Non-African Amer.): 50 — ABNORMAL LOW
Glucose: 93 mg/dL (ref 65–99)
Osmolality: 265 (ref 275–301)
Potassium: 4.4 mmol/L (ref 3.5–5.1)
SGPT (ALT): 13 U/L (ref 12–78)
Sodium: 131 mmol/L — ABNORMAL LOW (ref 136–145)
Total Protein: 5 g/dL — ABNORMAL LOW (ref 6.4–8.2)

## 2013-06-12 LAB — CBC
RBC: 3.29 10*6/uL — ABNORMAL LOW (ref 3.80–5.20)
WBC: 9.9 10*3/uL (ref 3.6–11.0)

## 2013-06-17 ENCOUNTER — Ambulatory Visit: Payer: Medicare Other | Admitting: General Surgery

## 2013-06-18 ENCOUNTER — Ambulatory Visit: Payer: Self-pay | Admitting: Gynecologic Oncology

## 2013-06-22 ENCOUNTER — Ambulatory Visit: Payer: Medicare Other | Admitting: General Surgery

## 2013-07-19 DEATH — deceased

## 2014-07-06 IMAGING — CT CT ABD-PELV W/O CM
1 of 2 series · 14 of 32 positions shown, 18 images · non-contrast
Comparison: none

REASON FOR EXAM: (1) liver mass, stage 4 ovarian cancer; (2) liver mass,
stage 4 ovarian cancer
COMMENTS:

[Series 2: 3mm soft tissue · axial · 0.68mm/px · z∈[-922,-526]mm · 14 of 151 slices shown, 18 images]
[im 13/151  soft-tissue]
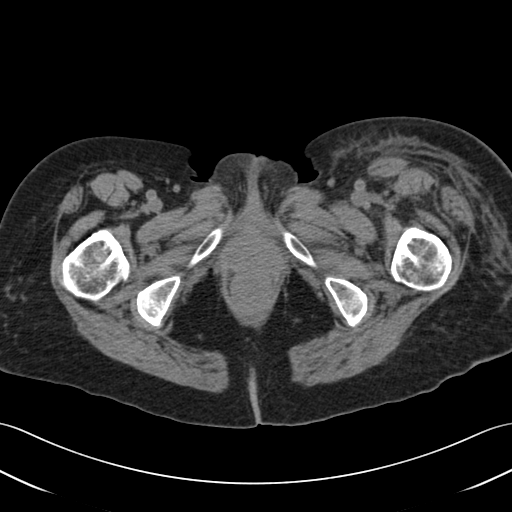
[im 13/151  bone]
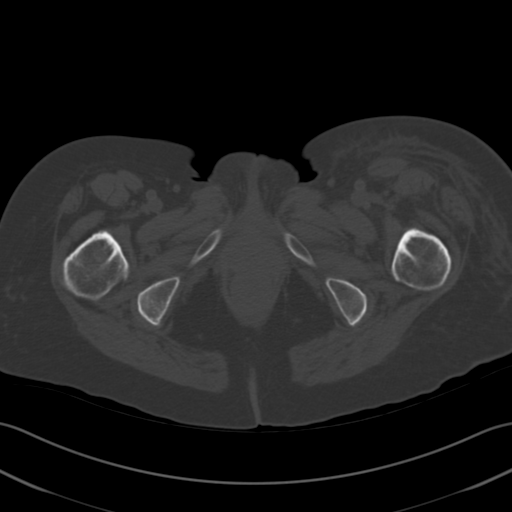
[im 25/151  soft-tissue]
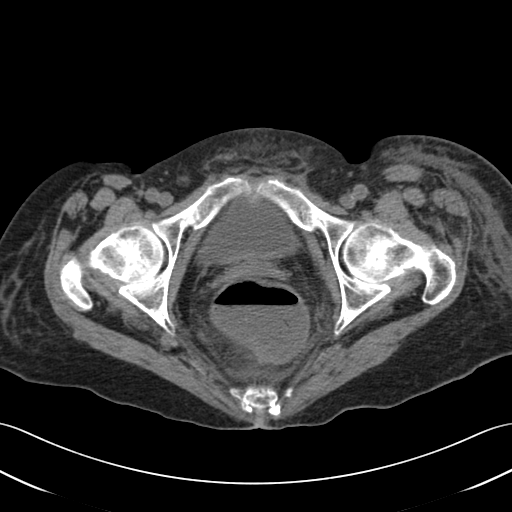
[im 37/151  soft-tissue]
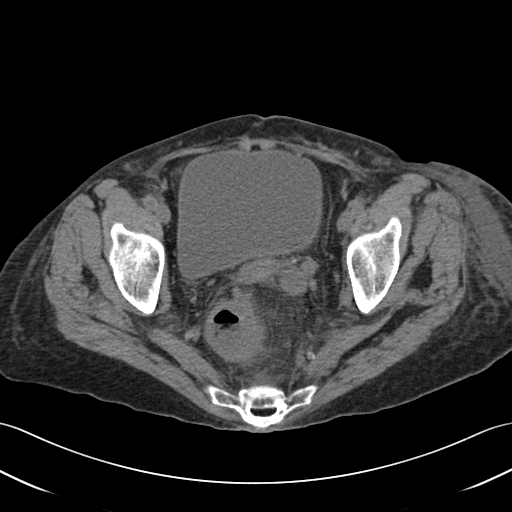
[im 49/151  soft-tissue]
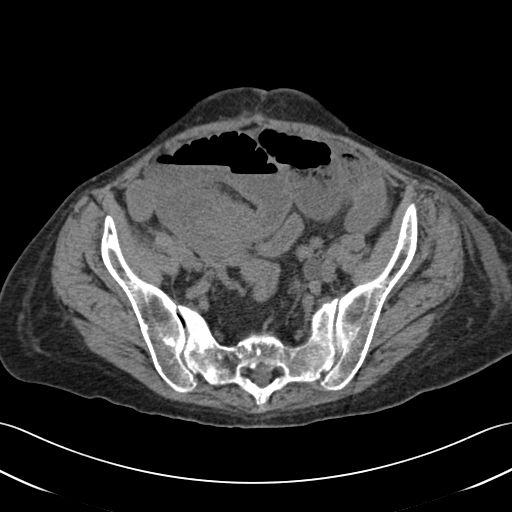
[im 61/151  soft-tissue]
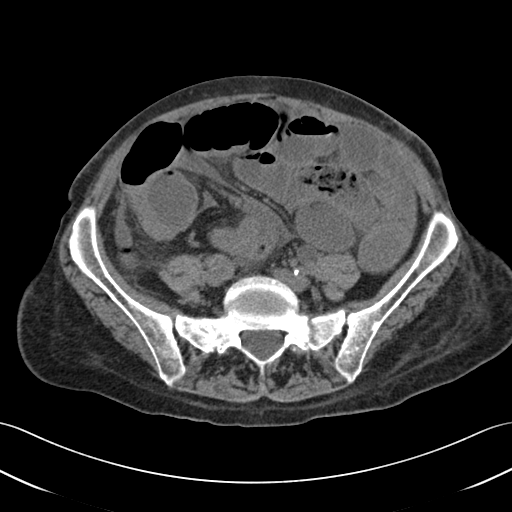
[im 73/151  soft-tissue]
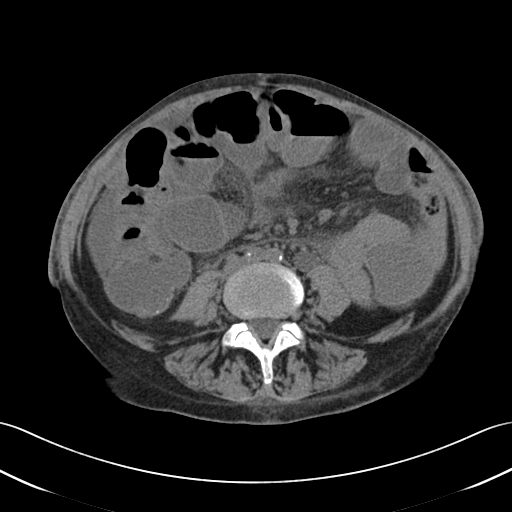
[im 85/151  soft-tissue]
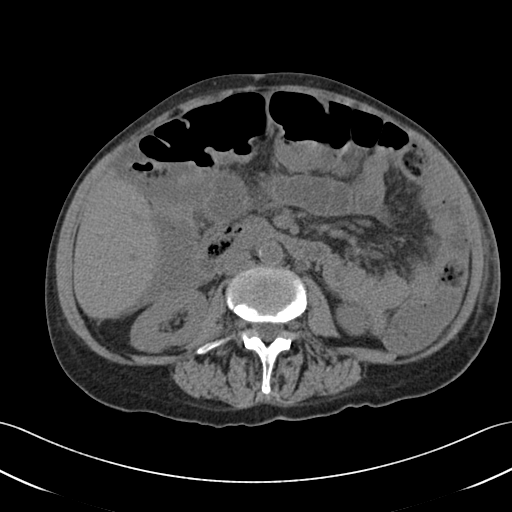
[im 97/151  soft-tissue]
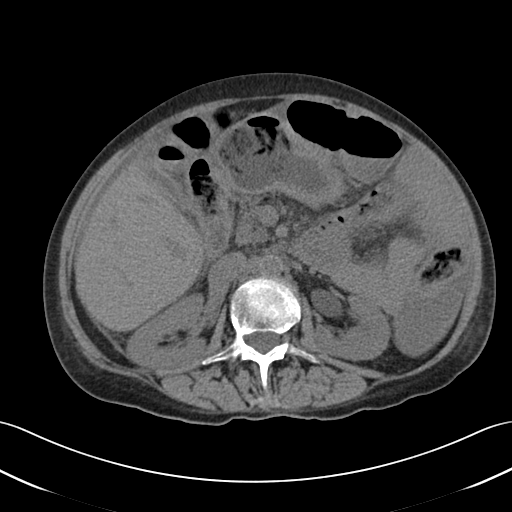
[im 109/151  soft-tissue]
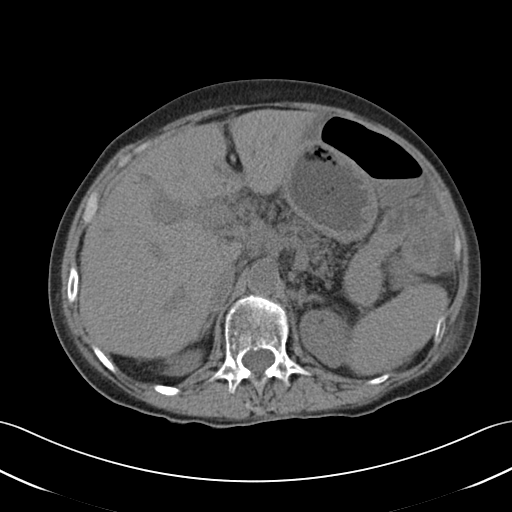
[im 109/151  bone]
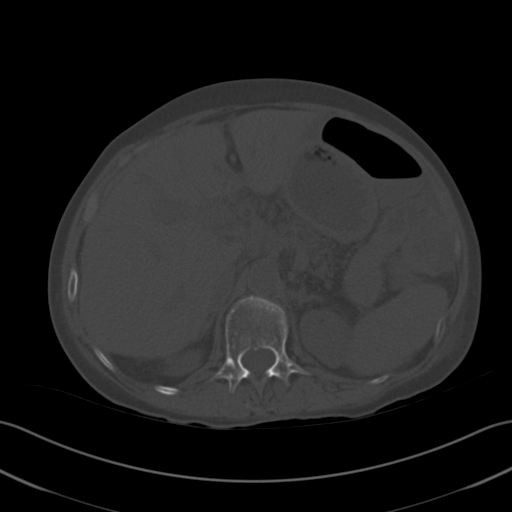
[im 121/151  soft-tissue]
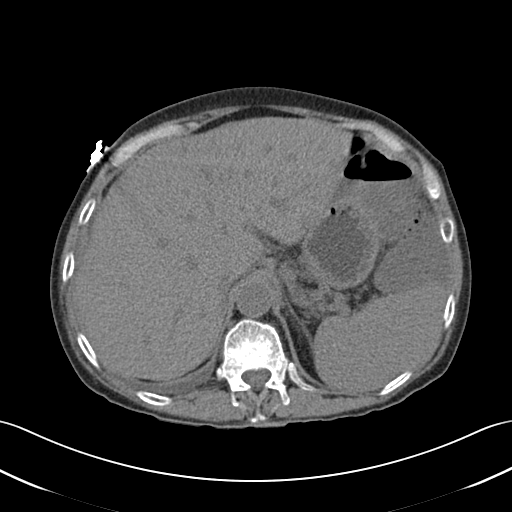
[im 127/151  lung]
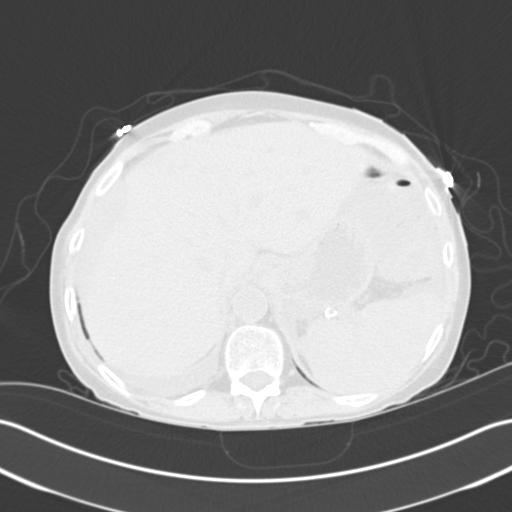
[im 133/151  soft-tissue]
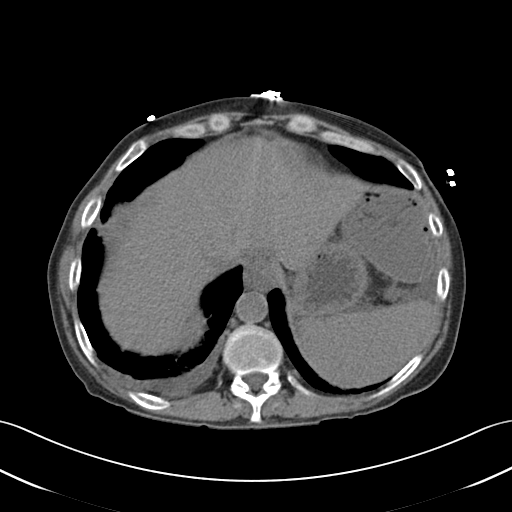
[im 133/151  lung]
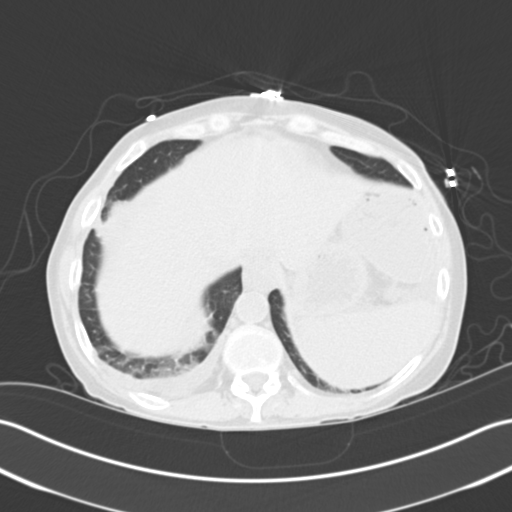
[im 139/151  lung]
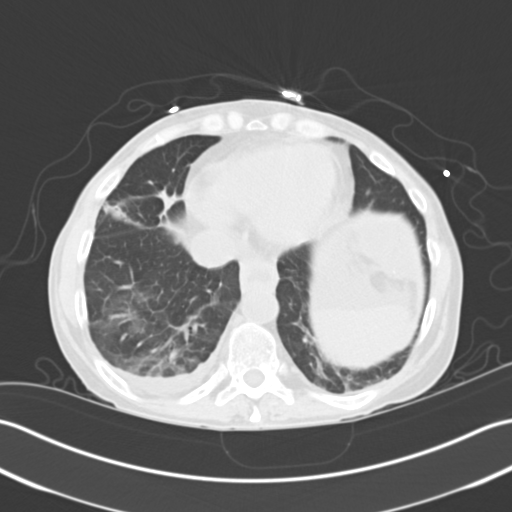
[im 145/151  soft-tissue]
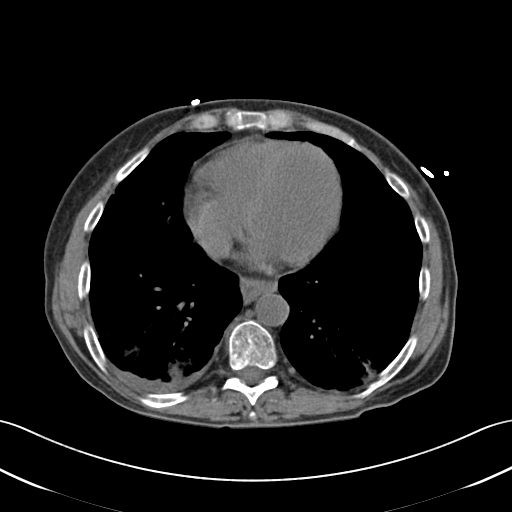
[im 145/151  lung]
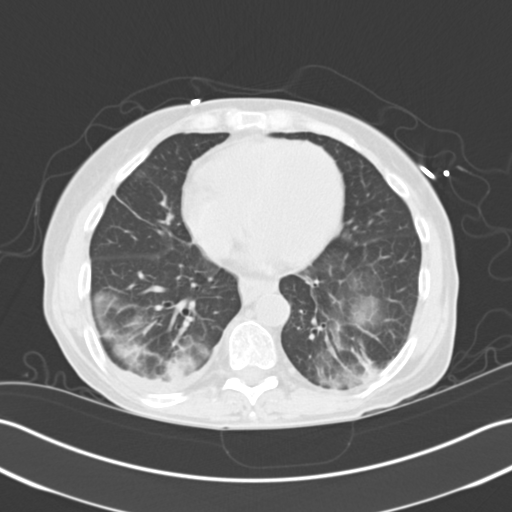

[14 of 32 positions shown; findings below may reference images not displayed]

PROCEDURE:     CT  - CT ABDOMEN AND PELVIS W[DATE] [DATE]

RESULT:      CT of the abdomen and pelvis is performed without IV or oral
contrast with images reconstructed at 3 mm slice thickness in the axial
plane. Sensitivity and anatomic resolution or significantly decreased given
the lack of oral and IV contrast in the face of distended loops of colon and
small bowel. Consider repeat imaging with oral and IV contrast if possible.

There is new moderate left hydroureter and mild left hydronephrosis. The
ureter appears to be enlarged into the left pelvis band to approximately
image 112. Medial to this there is a soft tissue mass measuring
approximately 3.20 cm anterior to posterior and 3.27 cm transversely on
image 112. It is difficult to tell if this is fluid-filled loop of bowel or
soft tissue mass given the lack of contrast. Given the changes in the ureter
this is likely a mass causing obstruction. There are distended loops of
small bowel with air-fluid levels present. There is moderate gastric
distention. There is a moderate amount of fluid in the colon in the
transverse colon region and in portions of the distal ascending and proximal
descending colon. There is a moderate amount of gastric contents remaining.
The liver is not well evaluated on noncontrast study. A discrete mass is not
definitely appreciated. The spleen is 11.8 cm in length superior to
inferior. There is an abnormal appearance along the inferior endplate of L3
and superior endplate of L1 which could represent changes with Schmorl's
nodes. There is some sclerosis along the superior endplate of T12 an
inferior endplate of L3. There is no nephrolithiasis or discrete renal mass.
The abdominal aorta is normal in caliber with atherosclerotic calcification
present. The a definite transition zone is not identified from the distended
loops of small bowel portions of small bowel intermittently visualized in
the pelvis appear to be nondistended. There is some soft tissue density in
the lateral aspect of the right pelvis anteriorly between images one hundred
and 108 measuring up to 2.9 cm anterior to posterior and approximately
cm transversely. There is a moderate amount of urine in the urinary bladder.
The adrenal glands are unremarkable. The lung bases shows trace amount of
right pleural effusion with some presumed atelectasis in both lower lobes.
Early infiltrate cannot be excluded. There is no discrete pulmonary mass.
IMPRESSION: Limited study for the reasons discussed above. If the
patient can be premedicated for contrast sensitivity and has appropriate
renal functions and followup with oral and IV contrast would be recommended
for better evaluation.
1. New left hydroureter and hydronephrosis it appears to be secondary to a
soft tissue mass in the left pelvis. There is also soft tissue density in
the anterior right pelvic region. The certainly be secondary to underlying
malignancy.

[REDACTED](*)

Addendum: There are distended loops of small bowel concerning for small
bowel obstruction.

## 2014-07-07 IMAGING — CR DG ABDOMEN 2V
1 series · 2 of 2 positions shown · non-contrast
Comparison: none

REASON FOR EXAM: PSBO
COMMENTS:   LMP: N/A

[Series 1: erect ap · 0.17mm/px · 2 of 2 slices shown]
[im 1/2]
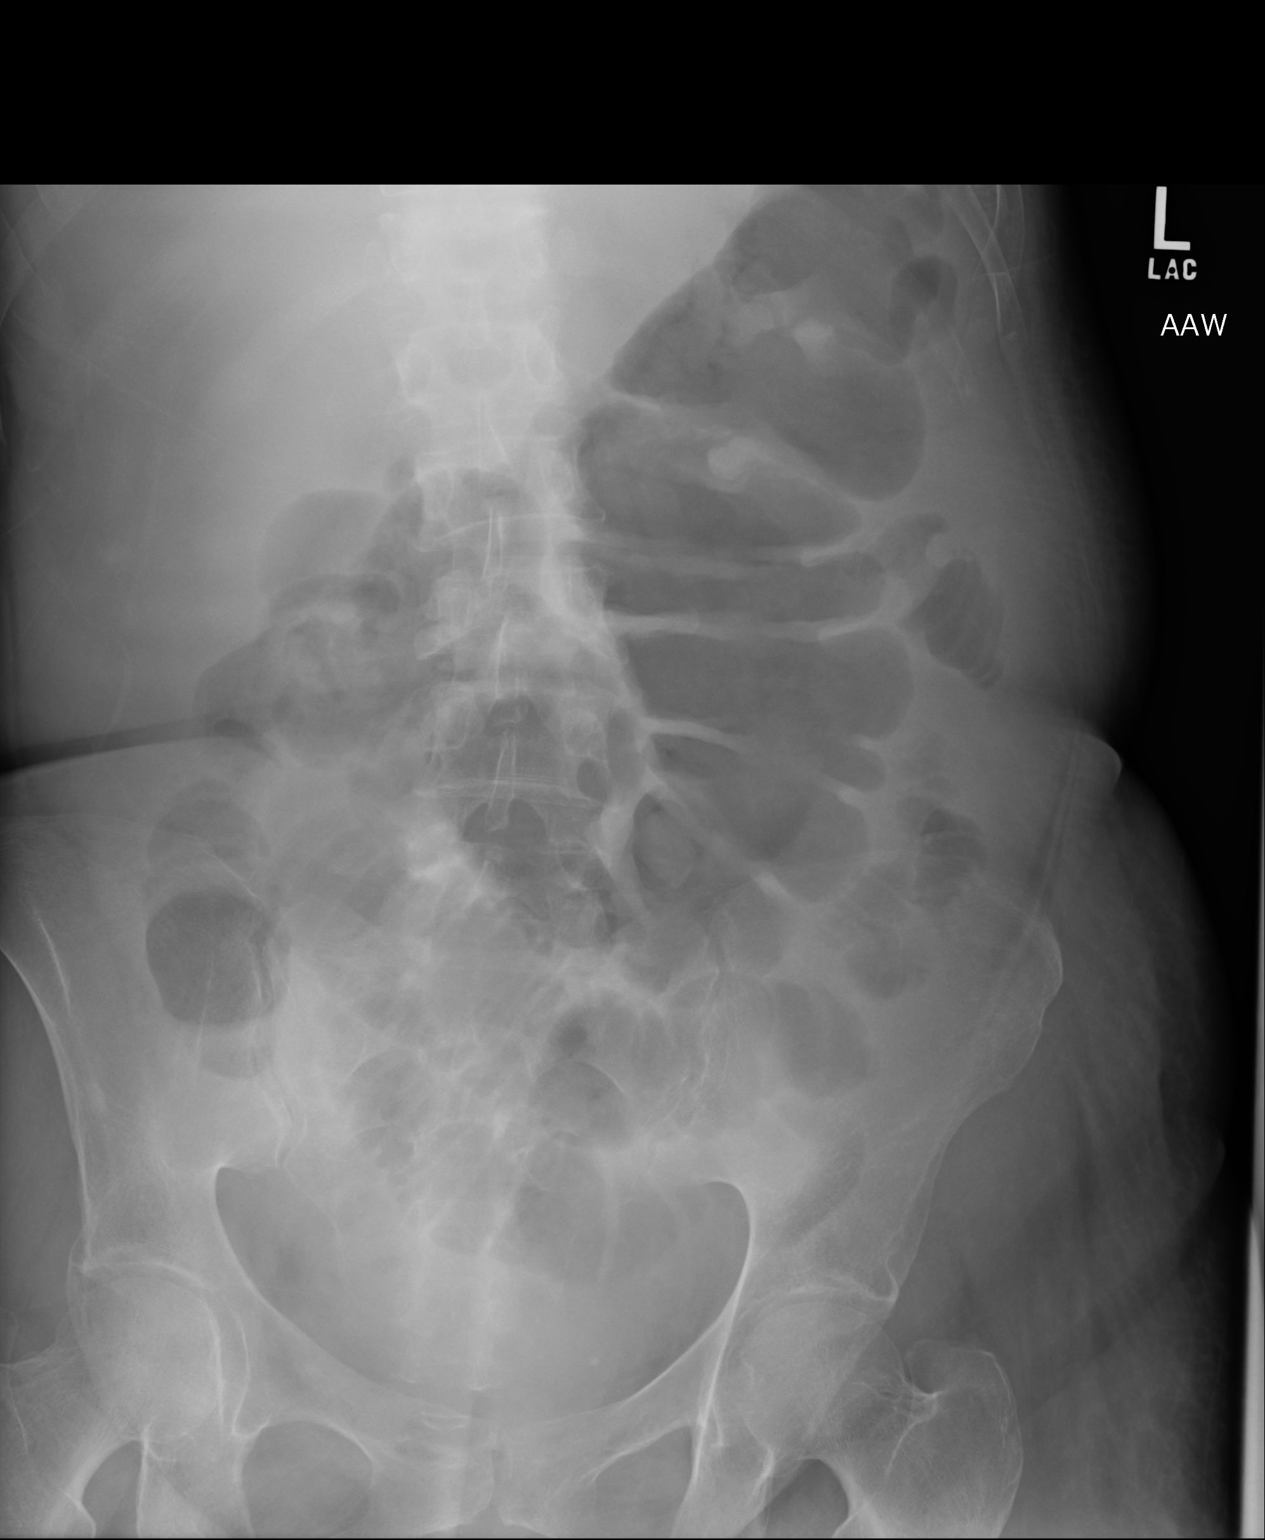
[im 2/2]
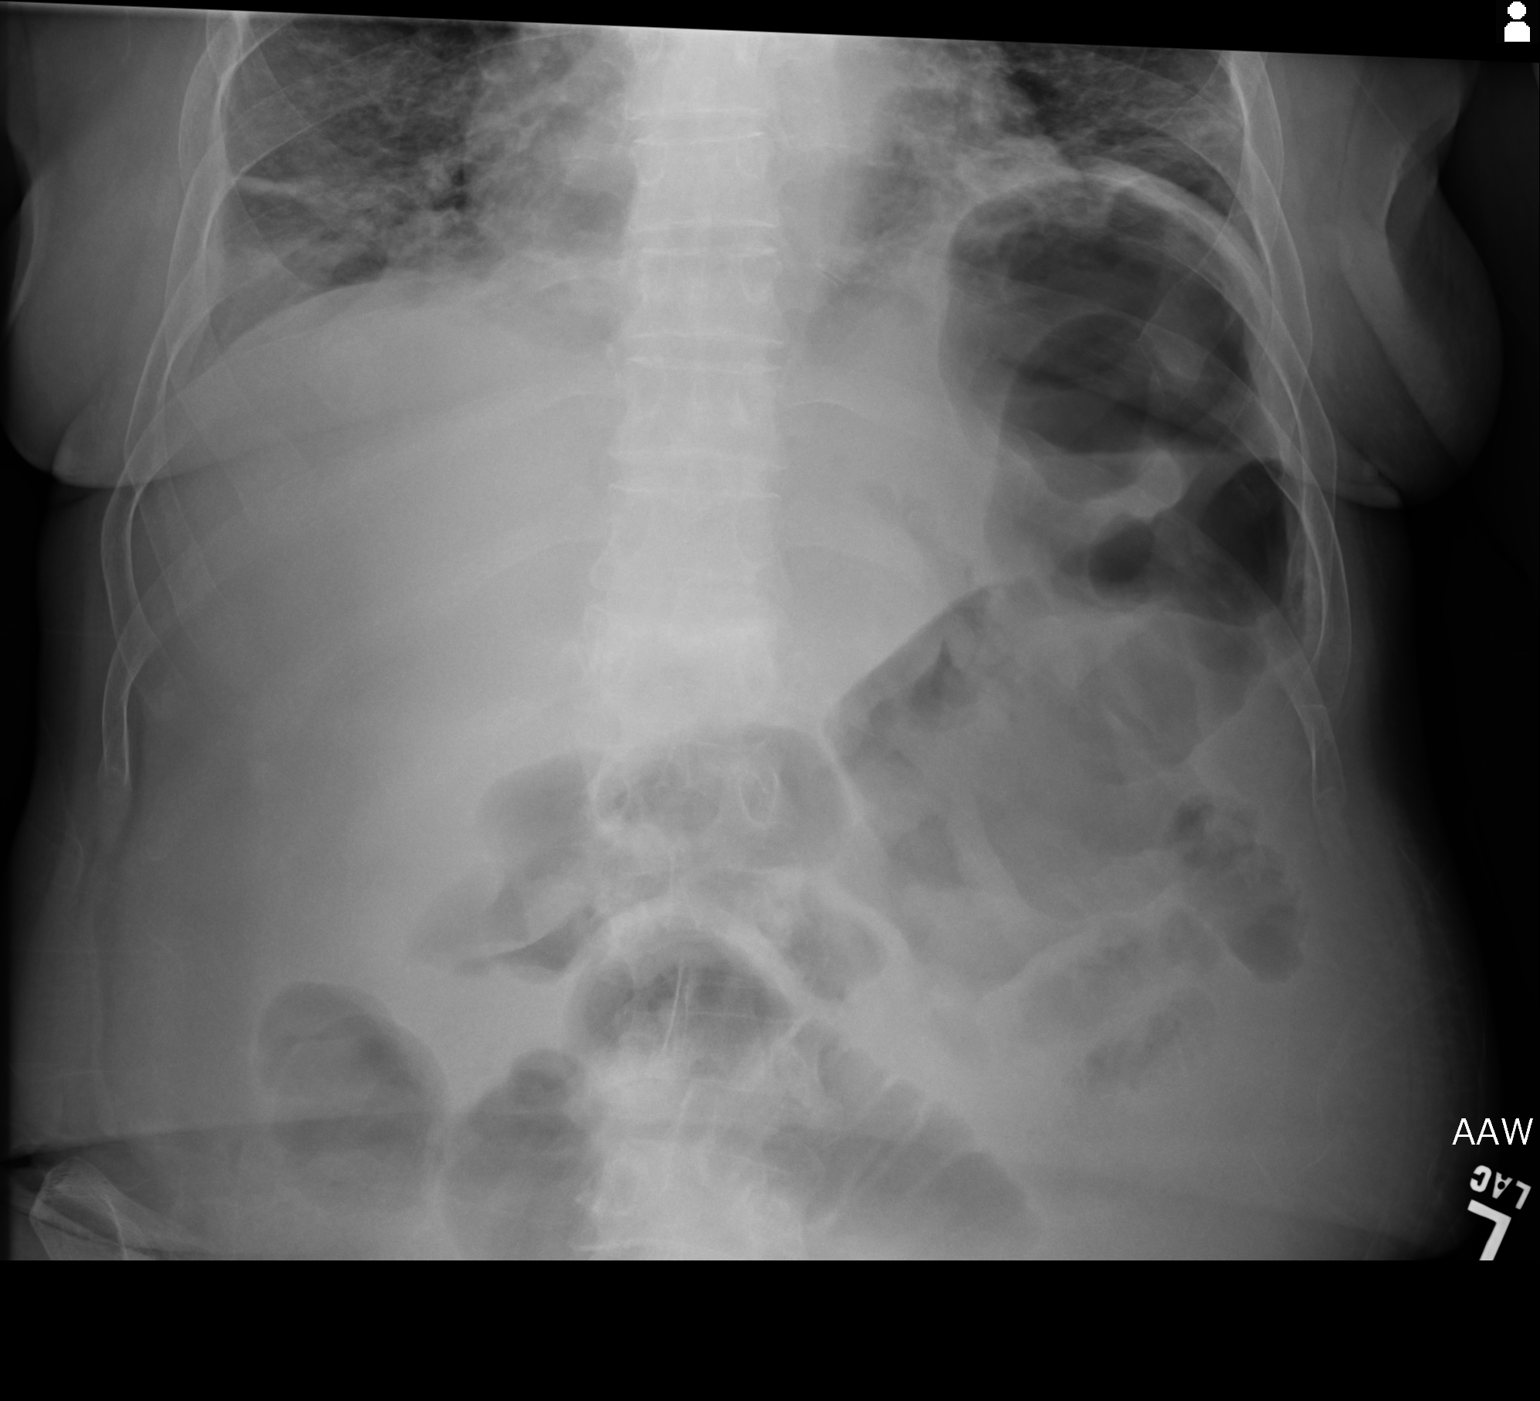

[2 of 2 positions shown; findings below may reference images not displayed]

PROCEDURE:     DXR - DXR ABDOMEN 2 V FLAT AND ERECT  - May 13, 2013  [DATE]

RESULT:     The bowel gas pattern suggests an ileus. There is a moderate
amount of gas within loops of mildly distended small bowel in the mid and
lower abdomen as well is within the ascending and transverse portions of the
colon. There is no significant rectal gas. There is no free extraluminal gas
collection. There are atelectatic changes at the lung bases.
IMPRESSION: The findings suggest a generalized ileus. There is no
evidence of perforation.

[REDACTED]

## 2014-09-19 ENCOUNTER — Encounter: Payer: Self-pay | Admitting: *Deleted

## 2015-03-10 NOTE — Consult Note (Signed)
PATIENT NAME:  Wanda Mann, Wanda Mann MR#:  [id number] DATE OF BIRTH:  May 22, 1951  DATE OF CONSULTATION:  05/12/2013  REQUESTING PHYSICIAN: Gladstone Lighter, MD  CONSULTING PHYSICIAN:  Robert Bellow, MD PRIMARY PHYSICIAN: Forest Gleason, MD   INDICATION FOR CONSULTATION: Abdominal pain, possible small bowel obstruction.   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 She underwent induction chemotherapy and subsequent bilateral salpingo-oophorectomy, open omentectomy and biopsy. She has been maintained on multiple chemotherapy regimens, most recently reinitiated in December 2013 for recurrent disease. She reports that she was in her usual health with declining appetite about 3 weeks ago; 2 to 3 days ago she began to notice some increasing loss of appetite, dry heaves 2 days ago and then vomiting and increasing abdominal pain in the last 24 hours. She has not vomited since admission to the hospital. The pain is in the upper abdomen and, to a lesser extent, to the periumbilical area. She was seen through the Emergency Room because of exacerbation of her pain, and ultrasound suggested a hepatic lesion. Subsequent hepatic CT was completed, including the abdomen and pelvis. The area identified on the ultrasound in the right lobe of the liver in the subcapsular location has been previously identified on CTs and appears significantly unchanged. There was, however, an increase in size of the mass in the left pelvis and a new mass in the right lower quadrant. Mild dilatation of the small bowel was seen on a noncontrast study, although there was extensive gas throughout the colon. The stomach was not as dilated as at the time of a February 2014 exam. The liver was difficult to assess because of the lack of contrast, although no obvious new lesions were identified. There is, however, a new left  hydroureter. The patient denies any back pain.   At the time of admission at approximately noon today she was afebrile with blood pressure 107/70, pulse 83, respirations of 20 and no evidence of respiratory distress.   LABORATORY STUDIES: On admission were notable for an elevated pro time, INR of 4.9, mild fall in her hemoglobin from 10.9 on 06/18 to 9.2 on 06/25.  MCV is normal at 93. White blood cell count normal at 5300.  A fall in her albumin from 3.2 to 2.5 and a modest rise in her alkaline phosphatase from 301 to 329 from the above-mentioned dates. There was, however, some modest improvement in her renal function with the creatinine falling from 1.45 to 1.26. The patient's creatinines earlier in this year were always less than 1.2. The patient is noted to be hypokalemic on admission and have a modest hypochloremia.  PHYSICAL EXAMINATION: GENERAL: The patient was modestly uncomfortable in the bed but otherwise alert and cooperative. She was attended by 1 of 3 daughters.  HEAD AND NECK: Unremarkable. No cervical adenopathy or thyromegaly.  CHEST: Clear to auscultation.  CARDIAC: Regular rhythm with a 2/6 systolic murmur over the aortic outflow tract.  ABDOMEN: The abdomen was mildly distended. Bowel sounds were present and normoactive. No hyperactive bowel sounds identified. There was mild tenderness with palpation reported although no evidence of guarding or referred pain. No incisional inguinal or femoral hernias. Femoral pulses were intact. The left lower extremity is modestly more swollen than the right consistent with her known left lower extremity DVT. The right lower extremity did not show any pitting edema as noted on the left. Pedal pulses were intact.  IMPRESSION:  1.  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  2.  Possible early small bowel obstruction, although no evidence of marked gastric distention or recurrent vomiting at this time.  3.  Markedly elevated  PT, INR.  4.  Left hydroureter.   RECOMMENDATIONS:   1.  At this time, an NG tube is not recommended as it may result in very troublesome bleeding from the nasopharynx and is unlikely to make a patient who is not presently nauseated or vomiting more comfortable. Urology evaluation for the left hydroureter should be considered. She should be maintained n.p.o. until the GI status is better established. At this time she is not a candidate for exploration based on her abnormal serum coagulation factors.  2.  Medical Oncology's input will be important.  3. The finding of the relatively stable subcapsular liver mass may represent an old subcapsular hematoma and does not necessarily represent metastatic disease. The rising alkaline phosphatase is indeterminate in this regard. We will follow the patient along with you.   ____________________________ Robert Bellow, MD [email or web address] D: 05/12/2013 20:21:15 ET T: 05/12/2013 20:51:25 ET JOB#: [id number]  cc: Robert Bellow, MD, <Dictator> Martie Lee. Oliva Bustard, MD Kilynn Fitzsimmons Amedeo Kinsman MD ELECTRONICALLY SIGNED 05/13/2013 9:25

## 2015-03-10 NOTE — H&P (Signed)
PATIENT NAME:  Wanda Mann, Wanda Mann MR#:  [id number] DATE OF BIRTH:  1950/12/03  DATE OF ADMISSION:  06/09/2013  HISTORY OF PRESENT ILLNESS:  This 64 year old female came in emergently to the Emergency Room with a chief complaint of abdominal pain with distention.  She was initially evaluated by the Emergency Room staff and referred for general surgery evaluation.  She has recently been in the hospital with stage IV ovarian cancer and had small bowel obstruction related to a mass in the right lower quadrant and had an ileotransverse colostomy and she gradually recovered from that and returned home approximately 10 days ago.  Has been taking some solid food, had been having some constipation, taken some laxatives, did pass gas earlier today, had a bowel movement today, but then approximately 4:00 she realized she had a lot of distention of the abdomen with pain.  She became nauseated, spit up just a small amount of watery fluid, had had some solid food earlier today, but the food did not come up.  There was no hematemesis.  She has had no rectal bleeding.  She has had no associated chills or fever.  Does feel mild degree of shortness of breath which was experienced on admission, was placed on some supplemental oxygen and breathing easier.  She had an x-ray on admission and I have reviewed that x-ray which demonstrates marked colonic dilatation.  Subsequent x-ray appears to demonstrate the distal tip of the nasogastric tube which is some 4 to 5 inches below the diaphragm.   PAST MEDICAL HISTORY:  Does include a hysterectomy some 32 years ago, also some four years ago was found to have ovarian cancer and did have a laparotomy with an oophorectomy and subsequently has had chemotherapy.  Does have a Port-A-Cath.  Has been under the care of Dr. Oliva Bustard from oncology.  Rheumatoid arthritis.   Other past medical history includes depression and anxiety, hypothyroidism, anemia.  Also has recently had deep venous thrombosis of  the left leg and has been treated with Coumadin.   PAST SURGICAL HISTORY:  Includes the above-mentioned hysterectomy in the distant past.  Has had the bilateral oophorectomy with debulking of ovarian cancer some four years ago and just recently had small bowel obstruction, laparotomy and anterotransverse colostomy.  Does have a Port-A-Cath.  Has had colonoscopy in the past.   MEDICATIONS:  Zolpendim 10 mg at bedtime as needed for sleep, Tylenol extra strength as needed for pain, tramadol 50 mg as needed for pain, Tandem 162/115 2 times per day, simethicone 80 mg simethicone q 4 hours as needed for gas, ropinirole 0.5 mg daily, promethazine 25 mg 3 times a day as needed for nausea, Premarin 0.625 mg daily, ondansetron 4 mg as needed for nausea, omeprazole 20 mg daily.  Has recently taken milk of magnesia, metoclopramide 10 mg 4 times a day, methotrexate 2.5 mg 4 tablets each week, Levothyroxine 50 mcg daily, folic acid 1 mg daily.  Fentanyl 75 mcg patch, duloxetine 60 mg daily, Demadex 20 mg 2 tablets daily, amitriptyline 25 mg daily, Aldactone 100 mg daily.  According to her discharge summary she was placed on Coumadin, although the patient is not aware of taking Coumadin.  ALLERGIES:  MORPHINE, CONTRAST DYE, PENICILLIN, MEROPENEM, ADVERSE REACTION TO MIRALAX.   REVIEW OF SYSTEMS:  She reports no recent visual acuity changes, but has had several blackout spells in recent days and also according to her daughters has had periods of disorientation, sometimes talking in her sleep.  She has  fallen several times the last few days, has suffered an abrasion of the lateral aspect right lower leg.  Also a smaller abrasion of the left elbow.  She reports had some mild degree of dyspnea on admission, but had been breathing satisfactorily prior to that.  She reports no chest pain.  She does report the diffuse abdominal discomfort with pain.  She reports she has been voiding satisfactorily.  She has had chronic edema of  both legs and does wear support stockings and also uses intermittent sequential compression devices at home, somewhat limited activities.  No chills or fever.  Does have chronic problems with her fingers related to rheumatoid arthritis.  Review of systems otherwise negative.   FAMILY HISTORY:  Positive for rheumatoid arthritis.   SOCIAL HISTORY:  Does not smoke, does not drink any alcohol.  She is accompanied by her two daughters.   PHYSICAL EXAMINATION: GENERAL:  She is awake, alert and oriented, resting on the ER stretcher.  Feels better than on admission.  VITAL SIGNS:  Her temperature is 98.2, pulse 102, respirations 20, blood pressure 103/64.   SKIN:  Does have abrasion of the left elbow which is about 1 cm and very superficial.  There is also a larger abrasion of the lateral aspect right lower leg which the skin has been raised.  There is necrotic skin over a distance of about 6 cm with a distal attachment.  There is some bloody discharge.  Otherwise, there is no rash or jaundice.  HEENT:  Pupils equal, reactive to light.  Extraocular movements are intact.  Sclerae clear.  Palpebral conjunctivae pale.  Pharynx is clear.  She has dentures.  NECK:  Very asthenic.  There is some mild degree of nodularity left posterior cervical chain.  LUNGS:  Sounds were clear with no wheezes, rales or rhonchi.  HEART:  Regular rhythm, S1 and S2 without murmur.  ABDOMEN:  Markedly distended and tympanitic and soft with no significant tenderness.  No guarding.  Active bowel sounds.  There is no palpable hernia.  She has a healing midline laparotomy incision with Steri-Strips intact.  There is no erythema at this site.   RECTAL:  External anal appearance is with no evidence of dermatitis.  No fissure was seen.  Digital exam demonstrates very tight sphincter tone, was quite difficult to insert an index finger.  The rectum contained gas.  No stool.  No palpable mass and when I applied traction in a posterior direction  that did cause her to release flatus and she could feel some improvement at that moment.   EXTREMITIES:  With the skin abrasions as noted above.  Does have diffuse edema of both lower extremities extending up to the buttocks.  Does have some ulnar deviation of her fingers and also contracture of the fingers.  NEUROLOGIC:  Awake, alert and oriented, moving all extremities.   CLINICAL DATA:  Her BUN is 20, creatinine 1.42, sodium 127, chloride 87, bicarb is 38, albumin is 2.1.  Total bilirubin 0.5, alkaline phosphatase 281.  Her troponin is less than 0.2.  Her white blood count 6600.  Hemoglobin 8.6 with an MCV of 89, platelet count 261,000.  Pro Time is 30.1 seconds.  I reviewed her abdominal x-ray images which demonstrate marked gaseous distention of the splenic flexure of the colon.  There was also gas in the rectum.  I also reviewed x-ray which demonstrates somewhat low lung volumes and nasogastric tube appears to extend below the diaphragm.   IMPRESSION:  1.  Colonic pseudoobstruction.  This appears to be unrelated to her recent surgery.   2.  Anemia.  3.  Hyponatremia.  4.  Hypochloremia.  5.  Metabolic alkalosis.  6.  Hypoalbuminemia.  7.  Stage IV ovarian cancer.  8.  History of deep venous thrombosis.  9.  Small superficial abrasion, left elbow.  10.  A larger superficial abrasion of right lower leg.  11.  Other diagnosis:  Recent presyncopal episode with intermittent episodes of disorientation and psychosis.   PLAN:   1.  I recommended admission to the hospital, discussed the Emergency Room staff, recommend internal medicine consultation with regard to black out spells.   2.  I anticipate normal saline intravenous.   3.  Also plan Nitrol ointment to apply to the anal os for treatment of colonic pseudoobstruction and also Dulcolax suppository.   4.  Anticipate serial dressing changes with regard to the abrasion of the right leg.   PROCEDURE NOTE:  The wound at the lateral aspect right  lower leg was cleaned with moist gauze and then sharply debrided the denuded skin with a pick-ups and scissors, excising the necrotic skin.  The extent of narcotic skin was approximately 2.5 cm x 6 cm with a distal flap.  There was no bleeding and this was completely anesthetic.  Subsequently, the wound was dressed with Vaseline gauze and 4 x 4 gauze and paper tape.    The elbow lesion was dressed with Band-Aid nonstick.   Emergency Room staff will consult internal medicine.   We will notify Dr. Jamal Collin of patient's admission.   The current visit comprised some 75 minutes and appears to be unrelated to her recent laparotomy with ileotransverse colostomy.    ____________________________ Lenna Sciara. Rochel Brome, MD jws:ea D: 06/09/2013 22:19:45 ET T: 06/09/2013 23:01:04 ET JOB#: [id number]  cc: Loreli Dollar, MD, <Dictator> Loreli Dollar MD ELECTRONICALLY SIGNED 06/10/2013 16:52

## 2015-03-10 NOTE — Consult Note (Signed)
PATIENT NAME:  Wanda Mann, Wanda Mann MR#:  [id number] DATE OF BIRTH:  July 23, 1951  DATE OF CONSULTATION:  06/10/2013  ATTENDING PHYSICIAN: Loreli Dollar, MD  CONSULTING PHYSICIAN:  Nicholes Mango, MD  PRIMARY CARE PHYSICIAN: Martie Lee. Choksi, MD (oncologist).  REASON FOR MEDICAL CONSULTATION: To evaluate dizziness.   HISTORY OF PRESENT ILLNESS: The patient is a 64 year old female with a past medical history of metastatic ovarian cancer with peritoneal seedings, ascites and history of DVT, on Coumadin, who comes into the ER with a chief complaint of abdominal pain associated with nausea and vomiting. The patient had a CAT scan of the abdomen which has revealed colonic pseudo-obstruction. The patient is admitted to surgical service to Dr. Tamala Julian. The patient had 1 episode of dizziness and sustained a fall yesterday. Hospitalist team is consulted regarding the dizziness evaluation. The patient is reporting that she recently had small bowel obstruction and had surgery done by Dr. Jamal Collin 2 weeks ago. She has been reporting that because of the abdominal pain and nausea, she is not drinking enough and felt dizzy when she suddenly stood up yesterday evening and sustained a fall. She had left elbow and right lower extremity laceration, which was cleaned by Dr. Tamala Julian, and a clean bandage was applied. The patient denies any chest pain or shortness of breath.   PAST MEDICAL HISTORY:  1. Ovarian cancer, stage IIIC, with peritoneal carcinomatosis.  2. Pancytopenia.  3. Metastatic ovarian cancer.  4. Rheumatoid arthritis.   PAST SURGICAL HISTORY:  1. Hysterectomy, bilateral oophorectomy.  2. Recently had a laparotomy and anterior transverse colostomy for small bowel obstruction. 3. Port-A-Cath placement.  4. History of colonoscopy in the past.   ALLERGIES: SHE IS ALLERGIC TO CONTRAST DYE, MEROPENEM, MIRALAX, MORPHINE, PENICILLIN.   PSYCHOSOCIAL HISTORY: Lives at home with boyfriend. Denies smoking, alcohol or illicit  drug usage.   FAMILY HISTORY: Rheumatoid arthritis runs in her family.   HOME MEDICATIONS:  1. Zolpidem 10 mg once daily.  2. Tylenol 2 tablets p.o. q.6 hours as needed.  3. Tramadol 50 mg 1 tablet p.o. once a day. 4. Simethicone 80 mg every 4 hours. 5. Promethazine 25 mg 3 times a day. 6. Premarin 0.625 one tablet p.o. once a day. 7. Zofran as needed. 8. Omeprazole 20 mg once daily.  9. Methotrexate 2.5 mg 4 tablets once a week. 10. Folic acid 1 mg once daily.  11. Levothyroxine 50 mcg once daily. 12. Duragesic patch transdermally for pain. 13. Demadex 20 mg 2 tablets once a day. 14. Aldactone 100 mg once daily.   REVIEW OF SYSTEMS:  CONSTITUTIONAL: Denies any fever or fatigue.  EYES: Denies any blurry vision, glaucoma.  ENT: No epistaxis or discharge.  RESPIRATION: Denies cough, COPD.  CARDIOVASCULAR: No chest pain, palpitations. Complaining of dizziness only 1 time today. GASTROINTESTINAL: Complaining of nausea, vomited x2 and abdominal pain.  GENITOURINARY: No dysuria or hematuria. GYNECOLOGIC AND BREASTS: Status post hysterectomy. Has ovarian cancer, status post oophorectomy. Denies any breast masses.  ENDOCRINE: Denies polyuria, nocturia. HEMATOLOGIC AND LYMPHATIC: Has chronic anemia. INTEGUMENTARY: No acne, rash or lesions.  MUSCULOSKELETAL: No joint pain in the neck, shoulder. Denies gout.  NEUROLOGIC: No history of vertigo, ataxia.  PSYCHIATRIC: Denies ADD, OCD.  PHYSICAL EXAMINATION: VITAL SIGNS: Temperature 98.2, pulse 94, respirations 20, blood pressure 107/58, pulse oximetry 97%.  GENERAL APPEARANCE: Not in acute distress. Moderately built and moderately nourished.  HEENT: Normocephalic, atraumatic. Pupils are equally reactive to light and accommodation. No sinus tenderness. No postnasal drip.  NECK: Supple. No JVD. No thyromegaly. Range of motion is intact.  LUNGS: Clear to auscultation bilaterally. No accessory muscle usage. No anterior chest wall tenderness on  palpation.  CARDIOVASCULAR: S1 and S2 normal. Regular rate and rhythm. No murmurs.  GASTROINTESTINAL: Firm. Tympanic bowel sounds are present in the upper right and left quadrants of the abdomen. In right and left lower quadrants, no bowel sounds are heard. Healing midline laparotomy incision with Steri-Strips. No guarding. No rebound tenderness.  EXTREMITIES: Right lower extremity superficial skin tear is clean and put a Band-Aid, and the left elbow superficial laceration is also clean, and clean bandage is applied. 1+ pitting edema.  NEUROLOGIC: Awake, alert and oriented x3. Cranial nerves II through XII are intact. Motor and sensory intact. Reflexes are 2+.  SKIN: Warm to touch. Normal turgor. No rashes. No lesions.   LABORATORY AND IMAGING STUDIES: CAT scan of the head: No evidence of acute ischemia or hemorrhagic infarction. No intracranial abnormalities. No hydrocephalus. Glucose 98, BUN 20, creatinine 1.42, sodium 127, potassium 3.6, chloride 87, CO2 38. GFR is 39. Anion gap 2. Calcium 7.9, total protein 5.4, albumin 2.1, bilirubin total 0.5, alkaline phosphatase 281. Troponin less than 0.02. WBC 6.6, hemoglobin 8.6, hematocrit is 26.5, platelets 261. PT 30.1, INR 3.0, PTT is 122.2. Urinalysis: Yellow in color, hazy in appearance, nitrite negative, leukocyte esterase negative, uric acid crystals are present in the urine.   ASSESSMENT AND PLAN: A 64 year old female with a history of metastatic ovarian cancer, admitted to Dr. Thompson Caul service for colonic pseudo-obstruction, and medical consult is placed regarding blackout spells.  1. Near-syncope, probably from dehydration. Will provide IV fluids and check orthostatics. Will repeat a.m. labs. The patient will be monitored on telemetry. Further workup if there is no significant improvement.  2. Hyponatremia and hypokalemia, probably from dehydration and vomiting. Will provide IV fluids.  3. Colonic pseudo-obstruction. Per surgery. 4. History of  metastatic ovarian cancer. The patient is to follow with Dr. Oliva Bustard as an outpatient.  5. History of deep venous thrombosis, on Coumadin. INR is at 3.   CODE STATUS: DNR. Daughter is medical power of attorney.   Thank you, Dr. Tamala Julian, for allowing hospitalist team to do the medical consult.   TOTAL TIME SPENT ON THE CONSULTATION: 45 minutes.    ____________________________ Nicholes Mango, MD ag:OSi D: 06/10/2013 08:07:22 ET T: 06/10/2013 09:10:24 ET JOB#: [id number]  cc: Nicholes Mango, MD, <Dictator> Nicholes Mango MD ELECTRONICALLY SIGNED 06/13/2013 7:10

## 2015-03-10 NOTE — Consult Note (Signed)
History of Present Illness:  Reason for Consult stage IV ovarian cancer, recurrent partial small bowel obstruction.   HPI   Patient admitted to the hospital for the 2nd time in under month with intractable nausea and vomiting secondary to partial small bowel junction.  Since having her NG tube placed, she feels significantly improved.  She otherwise feels well.  Her pain is well-controlled on her current narcotic regimen.  She has no neurologic complaints.  She denies any recent fevers.  She denies any chest pain or shortness of breath.  Her stomach was previously distended, but this is improved as well.  She has no urinary complaints.  Patient otherwise feels well and offers no further specific complaints.   PFSH:  Family History noncontributory   Social History negative alcohol, negative tobacco   Additional Past Medical and Surgical History no significant past medical history   Review of Systems:  Performance Status (ECOG) 2   Psych anxiety   Review of Systems   As per HPI. Otherwise, 10 point system review was negative.   NURSING NOTES: **Vital Signs.:   04-Jul-14 05:21   Vital Signs Type: Routine   Temperature Temperature (F): 97.5   Celsius: 36.3   Temperature Source: oral   Pulse Pulse: 79   Respirations Respirations: 20   Systolic BP Systolic BP: 818   Diastolic BP (mmHg) Diastolic BP (mmHg): 73   Mean BP: 87   Pulse Ox % Pulse Ox %: 93   Pulse Ox Activity Level: At rest   Oxygen Delivery: Room Air/ 21 %   Physical Exam:  Physical Exam General: thin, no acute distress. Eyes: Pink conjunctiva, anicteric sclera. HEENT: NG tube in place. Lungs: Clear to auscultation bilaterally. Heart: Regular rate and rhythm. No rubs, murmurs, or gallops. Abdomen: Soft, nontender, nondistended, normoactive bowel sounds. Musculoskeletal: No edema, cyanosis, or clubbing. Neuro: Alert, answering all questions appropriately. Cranial nerves grossly intact. Skin: No  rashes or petechiae noted. Psych: Normal affect.    PCN: Hives  Morphine: GI Distress  Contrast dye: GI Distress  MiraLax: Other    amitriptyline 25 mg oral tablet: 1 tab(s) orally once a day (at bedtime), Status: Active, Quantity: 30, Refills: 3   ondansetron 4 mg tablet: 1 tab(s) orally every 6 hours, As Needed - for Nausea, Vomiting, Status: Active, Quantity: 120, Refills: 3   DULoxetine 60 mg oral delayed release capsule: 1 cap(s) orally once a day, Status: Active, Quantity: 30, Refills: 2   omeprazole 20 mg oral delayed release capsule: 1 cap(s) orally once a day, Status: Active, Quantity: 30, Refills: 5   Premarin tablet 0.625 mg: 1 tab(s) orally once a day, Status: Active, Quantity: 90, Refills: None   levothyroxine 50 mcg (0.05 mg) oral tablet: 1 tab(s) orally once a day (at bedtime), Status: Active, Quantity: 90, Refills: 3   folic acid 1 mg oral tablet: 1 tab(s) orally once a day, Status: Active, Quantity: 0, Refills: None   methotrexate 2.5 mg oral tablet: 4 tab(s) orally once a week on Friday, Status: Active, Quantity: 20, Refills: None   Milk of Magnesia 8% oral suspension: 30 milliliter(s) orally once a day (at bedtime), Status: Active, Quantity: 0, Refills: None   Tandem 162 mg-115.2 mg oral capsule: 1 cap(s) orally 2 times a day, Status: Active, Quantity: 60, Refills: 3   Duragesic-100 100 mcg/hr transdermal film, extended release: 2 PATCH transdermal every 72 hours along with fentanyl 75 mcg patch, Status: Active, Quantity: 20, Refills: None   Tylenol Extra Strength  500 mg oral tablet: 2 tab(s) orally every 6 hours, As Needed - for Pain, Status: Active, Quantity: 0, Refills: None   fentaNYL 75 mcg/hr transdermal film, extended release: 1 patch transdermal every 72 hours along with two fentanyl 100 mcg patches, Status: Active, Quantity: 10, Refills: None   rOPINIRole 0.5 mg oral tablet: 1 tab(s) orally once a day (at bedtime), Status: Active, Quantity: 30, Refills:  3   zolpidem 10 mg oral tablet: 1 tab(s) orally once a day (at bedtime), Status: Active, Quantity: 30, Refills: 3   tramadol 50 mg oral tablet: 1 tab(s) orally once a day, As Needed - for Pain, Status: Active, Quantity: 15, Refills: None   promethazine 25 mg oral tablet: 1 tab(s) orally 3 times a day, As Needed - for Nausea, Vomiting, Status: Active, Quantity: 45, Refills: None   Lasix 40 mg oral tablet: 1 tab(s) orally 2 times a day, Status: Active, Quantity: 0, Refills: None  Laboratory Results: Routine Chem:  04-Jul-14 04:30   Result Comment platelet - SLIGHT PLATELET CLUMPING IN SPECIMEN. ACTUAL  - NUMERICAL COUNT MAY BE SOMEWHAT HIGHER THAN  - THE REPORTED VALUE.  Result(s) reported on 21 May 2013 at 06:36AM.  Glucose, Serum 69  BUN 11  Creatinine (comp) 0.98  Sodium, Serum  134  Potassium, Serum 3.9  Chloride, Serum 100  CO2, Serum 26  Calcium (Total), Serum  6.9  Anion Gap 8  Osmolality (calc) 266  eGFR (African American) >60  eGFR (Non-African American) >60 (eGFR values <102m/min/1.73 m2 may be an indication of chronic kidney disease (CKD). Calculated eGFR is useful in patients with stable renal function. The eGFR calculation will not be reliable in acutely ill patients when serum creatinine is changing rapidly. It is not useful in  patients on dialysis. The eGFR calculation may not be applicable to patients at the low and high extremes of body sizes, pregnant women, and vegetarians.)  Routine Coag:  04-Jul-14 04:30   Prothrombin  26.1  INR 2.5 (INR reference interval applies to patients on anticoagulant therapy. A single INR therapeutic range for coumarins is not optimal for all indications; however, the suggested range for most indications is 2.0 - 3.0. Exceptions to the INR Reference Range may include: Prosthetic heart valves, acute myocardial infarction, prevention of myocardial infarction, and combinations of aspirin and anticoagulant. The need for a higher  or lower target INR must be assessed individually. Reference: The Pharmacology and Management of the Vitamin K  antagonists: the seventh ACCP Conference on Antithrombotic and Thrombolytic Therapy. CQASTM.1962Sept:126 (3suppl): [id number] A HCT value >55% may artifactually increase the PT.  In one study,  the increase was an average of 25%. Reference:  "Effect on Routine and Special Coagulation Testing Values of Citrate Anticoagulant Adjustment in Patients with High HCT Values." American Journal of Clinical Pathology 2006;126:400-405.)  Routine Hem:  04-Jul-14 04:30   WBC (CBC) 5.3  RBC (CBC)  2.86  Hemoglobin (CBC)  8.7  Hematocrit (CBC)  26.2  Platelet Count (CBC) 390  MCV 92  MCH 30.3  MCHC 33.1  RDW  17.8  Bands 8  Segmented Neutrophils 59  Lymphocytes 17  Monocytes 12  Eosinophil 2  Metamyelocyte 1  Myelocyte 1  Diff Comment 1 ANISOCYTOSIS  Diff Comment 2 POLYCHROMASIA  Diff Comment 3 PLTS VARIED IN SIZE  Result(s) reported on 21 May 2013 at 06:36AM.   Radiology Results: CT:    03-Jul-14 00:21, CT Abdomen and Pelvis Without Contrast  CT Abdomen and Pelvis Without Contrast  REASON FOR EXAM:    (1) epigastric pain; (2) epigastric pain;    NOTE:   Nursing to Give Oral CT Contr  COMMENTS:       PROCEDURE: CT  - CT ABDOMEN AND PELVIS W0  - May 20 2013 12:21AM     RESULT: Noncontrast CT of the abdomen and pelvis is performedutilizing   oral contrast only. Comparison is made to previous contrast-enhanced exam   dated 01/14/2013 and to a noncontrast exam from 05/12/2013. The lack of   iodinated intravenous contrast decreases sensitivity for detecting   peritoneal masses an serosal surface masses. The oral contrast is   predominantly in the stomach with a small amount in distended loops of   small bowel.    There is bilateral lower lobe pneumonia small bilateral pleural     effusions. There is a moderate amount of oral contrast within the   esophagus concern for  gastroesophageal reflux which certainly could be   the etiology for the pneumonia. There is no pneumatosis or   pneumoperitoneum. There is persistent minimal to moderate left   hydronephrosis and hydroureter to a left pelvic mass. The left pelvic   mass and right pelvic mass mentioned previously appear to be unchanged.   The liver show some calcification medially in the right lobe on image 58   with some small low-attenuation areas along the inferior portion of the   liver which could represent metastatic lesions. The aorta is normal in   caliber. There is a small to moderate amount of fluid within the colon.   The adrenal glands appear unremarkable. The pancreas shows evidence of   fatty infiltration. There do appear to be some nondistended loops of   small bowel in the pelvis. The bony structures appear intact. No   radiopaque gallstones are evident.  IMPRESSION:   1. Persistent changes of small bowel obstruction.  2. Left hydronephrosis and hydroureter possibly secondary to a left   pelvic mass. Stable left pelvic mass and right pelvic mass.  3. Bilateral lower lobe pneumonia with small bilateral pleural effusions.   There is evidence of gastroesophageal reflux which could be contributing   to this.  4. Possible small hepatic metastasis.    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    Plan:   1.  Ovarian cancer:  Patient last received chemotherapy with single agent Cytoxan on May 05, 2013.  She is also receiving Neupogen with her last dose given on May 15, 2013.  She has been instructed to keep her previously scheduled followup appointment with Dr. Oliva Bustard on May 26, 2013.   CT scan results noted.  Recurrent partial small bowel obstruction: Conservative management has improved her symptoms, but given her underlying disease the  chances are this will recur again.  Her primary surgeon Dr. Jamal Collin and her primary oncologist Dr. Oliva Bustard will further discuss whether aggressive measures are necessary on Monday. Pain: Continue current narcotic regimen. DVT: Patient is n.p.o. therefore Coumadin has been discontinued and patient is currentlyreceiving Lovenox. Anemia: Likely secondary to chemotherapy.  Continue to monitor and transfuse if hemoglobin falls below 7.0. consult, will follow.   CC Referral:  cc: Dr. Cristi Loron   Electronic Signatures: Delight Hoh (MD)  (Signed 04-Jul-14 13:13)  Authored: HISTORY OF PRESENT ILLNESS, PFSH, ROS, NURSING NOTES, PE, ALLERGIES,  HOME MEDICATIONS, LABS, OTHER RESULTS, ASSESSMENT AND PLAN, CC Referring Physician   Last Updated: 04-Jul-14 13:13 by Delight Hoh (MD)

## 2015-03-10 NOTE — Consult Note (Signed)
VITAL SIGNS/ANCILLARY NOTES:  **Vital Signs.:   26-Jun-14 13:14  Vital Signs Type Routine  Temperature Temperature (F) 98  Celsius 36.6  Temperature Source oral  Pulse Pulse 84  Respirations Respirations 20  Systolic BP Systolic BP 96  Diastolic BP (mmHg) Diastolic BP (mmHg) 62  Mean BP 73  Pulse Ox % Pulse Ox % 92  Pulse Ox Activity Level  At rest  Oxygen Delivery Room Air/ 21 %   Brief Assessment:  Cardiac Regular    Additional Physical Exam when seen on 6/26, abdostill tender, no rebound, still tympanitic neuro non focal alert and cooperati ve, pallor no cough no wheeze   Lab Results:  Routine Chem:  26-Jun-14 05:23   Result Comment WBC - RESULTS VERIFIED BY REPEAT TESTING.  - CRITICAL VALUE PREVIOUSLY NOTIFIED.  - CALLED TO SYLIVIA FUENTES:05/13/13 AT  - 0623...TPL  - READ-BACK PROCESS PERFORMED.  Result(s) reported on 13 May 2013 at 06:22AM.  Glucose, Serum 87  BUN 16  Creatinine (comp) 1.00  Sodium, Serum 139  Potassium, Serum 3.7  Chloride, Serum 103  CO2, Serum 32  Calcium (Total), Serum  7.2  Anion Gap  4  Osmolality (calc) 278  eGFR (African American) >60  eGFR (Non-African American) >60 (eGFR values <60mL/min/1.73 m2 may be an indication of chronic kidney disease (CKD). Calculated eGFR is useful in patients with stable renal function. The eGFR calculation will not be reliable in acutely ill patients when serum creatinine is changing rapidly. It is not useful in  patients on dialysis. The eGFR calculation may not be applicable to patients at the low and high extremes of body sizes, pregnant women, and vegetarians.)  Routine Hem:  26-Jun-14 05:23   WBC (CBC)  1.5  RBC (CBC)  2.38  Hemoglobin (CBC)  7.4  Hematocrit (CBC)  22.5  Platelet Count (CBC) 177  MCV 95  MCH 31.3  MCHC 33.0  RDW  19.4  Neutrophil % 75.5  Lymphocyte % 9.8  Monocyte % 7.7  Eosinophil % 5.2  Basophil % 1.8  Neutrophil #  1.1  Lymphocyte #  0.1  Monocyte #  0.1   Eosinophil # 0.1  Basophil # 0.0   Assessment/Plan:  Assessment/Plan:  Assessment PROGRESSIVE OVARIAN CANCER  CA 125 IS 667  APPRECIATE SURGERY NOTE. HAVE DISCUSSED WITH SURGERY  APPRECIATE UROLOGY NOTE.  NEUTROPHILS LOWER TODAY, EFFECT OF PRIOR CHEMOTX, BUT ADEQUATE ABSOLUTE NEUTROPHILS. NO FEVER. NO PROTIME TODAY, WOULD STILL BE HIGH, WAS INR 4.9 YESTERDAY   Plan F/U CBC.  IF NEUTROPHILS FALL FURTHUR THEN WOULD START NEUPOGEN DAILY. HOLD COUMADIN. CHECK DAILY PROTIME, WHEN INR LESS THAN 2 WOULD START LOVENOX, AS DISCUSSED WITH DR BYRNETT.   Electronic Signatures: Gittin, Robert G (MD)  (Signed 27-Jun-14 17:21)  Authored: VITAL SIGNS/ANCILLARY NOTES, Brief Assessment, Lab Results, Assessment/Plan   Last Updated: 27-Jun-14 17:21 by Gittin, Robert G (MD) 

## 2015-03-10 NOTE — Op Note (Signed)
PATIENT NAME:  Wanda Mann, Wanda Mann MR#:  [id number] DATE OF BIRTH:  [birth year]  DATE OF PROCEDURE:  05/25/2013  PREOPERATIVE DIAGNOSIS: Recurrent ovarian carcinoma with partial small bowel obstruction.   POSTOPERATIVE DIAGNOSIS: Recurrent ovarian carcinoma with partial small bowel obstruction.   OPERATION: Laparoscopy with conversion to laparotomy and ileal transverse colostomy.   SURGEON:  Robinette Haines, M.D.   ANESTHESIA: General.   COMPLICATIONS: None.   ESTIMATED BLOOD LOSS: Less than 50 mL.   DRAINS: None.   DESCRIPTION OF PROCEDURE: The patient was put to sleep in the supine position on the operating table. Foley catheter was inserted. The abdomen was prepped and draped out as a sterile field. In the mid epigastric region, a small vertical incision was made for port placement. It was dissected down to the fascia which was lifted up and a small opening made in the fascia. With careful further dissection, the peritoneal cavity was entered into and a 11 mm port was placed. Pneumoperitoneum obtained. With the camera in place, it appeared there omental adhesion draped across the midportion of the abdomen which precluded visualization of the pelvic area except for a window on the left side through which only minimal visualization was noted. At this point, it was felt better to do a minilaparotomy in the lower abdomen to assess the area of obstruction to see if a bypass or resection and anastomosis is possible. The port was removed. The fascial opening was closed with 2 figure-of-eight stitches of 0 Prolene and the skin with staples. In the lower midline, the previous incision was opened from below the level of the umbilicus down towards the lower end of the incision and deepened through the layers carefully until the peritoneal cavity was entered into. There were no adhesions to the abdominal wall and no peritoneal studding was noted. However, there were some spotty metastatic nodules in the mesentery of the  bowel. Further exploration was performed to reveal that the proximal small bowel was relatively normal but the distal small bowel was distended and in following it along the course from the jejunum down, the ileum was noted to be going into one area of pelvic tumor which appeared to be somewhat hard to delineate and involved not only the terminal ileum, but also the cecal area. The entire area was puckered down and it was felt that it was not advisable to try to resect this. A bypass was therefore planned proximal to the area of obstruction as the terminal ileum was brought up against the mid transverse colon along the tenia and a side-to-side anastomosis was performed with the GIA stapling device. The remaining opening was then closed with a TA-60 stapler and the corners of the staple lines reinforced with 3-0 silk. A satisfactory anastomosis was noted at this site. Bowel was placed back in its position. The peritoneum and fascia closed in a single layer with interrupted figure-of-eight stitches of 0 Prolene. Subcutaneous tissue was closed with 3-0 Vicryl and the skin approximated with staples. On performing the anastomosis and  ensuring this was adequately sealed, gown, gloves and instruments were all changed before continuing with the procedure. At the end of the procedure, the patient was extubated and returned to the recovery room in stable condition.    ____________________________ S.Robinette Haines, MD sgs:rw D: 05/25/2013 16:50:51 ET T: 05/25/2013 17:54:39 ET JOB#: [id number]  cc: Synthia Innocent. Jamal Collin, MD, <Dictator> [facility] Robinette Haines MD ELECTRONICALLY SIGNED 05/26/2013 7:05

## 2015-03-10 NOTE — Consult Note (Signed)
History of Present Illness:  Reason for Consult Patient was  Admitted with  distention  of abdomen, and G-tube was placed in surgical consultation was obtained. Abdominal distention improved.  Patient had a previous history of recurrent ovarian cancer and now under hospice care   HPI   OF PRESENT ILLNESS: The patient is a 64 year old female with a past medical history of metastatic ovarian cancer with peritoneal seedings, ascites and history of DVT, on Coumadin, who comes into the ER with a chief complaint of abdominal pain associated with nausea and vomiting. The patient had a CAT scan of the abdomen donefor colonic pseudo-obstruction. The patient is admitted to surgical service to Dr. Tamala Julian. The patient had 1 episode of dizziness and sustained a fall yesterday. Hospitalist team is consulted regarding the dizziness evaluation. The patient is reporting that she recently had small bowel obstruction and had surgery done by Dr. Jamal Collin 2 weeks ago. She has been reporting that because of the abdominal pain and nausea, she is not drinking enough and felt dizzy when she suddenly stood up yesterday evening and sustained a fall. She had left elbow and right lower extremity laceration, which was cleaned by Dr. Tamala Julian, and a clean bandage was applied.   Review of Systems:  General weakness  fatigue    Performance Status (ECOG) 2    HEENT no complaints    Lungs no complaints    Cardiac no complaints    GI as per HPI    GU no complaints    Extremities swelling    Neuro no complaints    Psych no complaints  anxiety    NURSING NOTES:  **Vital Signs.:   24-Jul-14 18:00   Vital Signs Type: Q 4hr   Temperature Temperature (F): 98.3   Celsius: 36.8   Temperature Source: oral   Pulse Pulse: 81   Respirations Respirations: 18   Systolic BP Systolic BP: 884   Diastolic BP (mmHg) Diastolic BP (mmHg): 64   Mean BP: 76   Pulse Ox % Pulse Ox %: 93   Pulse Ox Activity Level: At rest    Oxygen Delivery: 2L   Physical Exam:  HEENT: normal    Lungs: clear    Cardiac: regular rate, rhythm    Skin: intact    Extremities: edema    Neuro: AAOx3  cranial nerves intact    Psych: alert and cooperative    Physical Exam Patient is alert oriented lying in the bed not in any acute distress Abdomen: Distended.  And G-tube being.  Bowel sounds are diminished.       Depression:    Chemotherapy:    Anemia: previous blood transfusions   Hypothyroidism:    stage 4 ovarian cancer: Feb 2010   Peritoneal carcinomatosis:    constipation:    anxiety attacks:    BSO  Laparotomy  Omentectomy  Staging Biopsies: 14-Mar-2009   Port- A-Cath:    Laparoscopy:    colonoscopy 020810:    tubal ligation:    Hysterectomy - Total:    PCN: Hives  Morphine: GI Distress  Contrast dye: GI Distress  MiraLax: Other  Meropenem: Rash      simethicone 80 mg oral tablet, chewable: 1 tab(s) orally every 4 hours, As Needed FOR BLOATING    HOSPICE PATIENT, Status: Active, Quantity: 90, Refills: None   metoclopramide 10 mg oral tablet: 1 tab(s) orally 4 times a day (before meals and at bedtime)    HOSPICE PATIENT, Status: Active, Quantity: 60, Refills: None  ondansetron 4 mg oral tablet: 1 tab(s) orally every 4 to 6 hours, As Needed - for Nausea, Vomiting    HOSPICE PATIENT, Status: Active, Quantity: 90, Refills: None   Aldactone 100 mg oral tablet: 1 tab(s) orally once a day, Status: Active, Quantity: 30, Refills: None   Demadex 20 mg oral tablet: 2 tab(s) orally once a day, Status: Active, Quantity: 60, Refills: None   tramadol 50 mg oral tablet: 1 tab(s) orally once a day, As Needed - for Pain, Status: Active, Quantity: 15, Refills: None   Tandem 162 mg-115.2 mg oral capsule: 1 cap(s) orally 2 times a day, Status: Active, Quantity: 180, Refills: 3   omeprazole 20 mg oral delayed release capsule: 1 cap(s) orally once a day, Status: Active, Quantity: 90, Refills: 3    amitriptyline 25 mg oral tablet: 1 tab(s) orally once a day (at bedtime), Status: Active, Quantity: 90, Refills: 3   DULoxetine 60 mg oral delayed release capsule: 1 cap(s) orally once a day, Status: Active, Quantity: 30, Refills: 2   Premarin tablet 0.625 mg: 1 tab(s) orally once a day, Status: Active, Quantity: 90, Refills: None   levothyroxine 50 mcg (0.05 mg) oral tablet: 1 tab(s) orally once a day (at bedtime), Status: Active, Quantity: 90, Refills: 3   folic acid 1 mg oral tablet: 1 tab(s) orally once a day, Status: Active, Quantity: 0, Refills: None   methotrexate 2.5 mg oral tablet: 4 tab(s) orally once a week on Friday, Status: Active, Quantity: 20, Refills: None   Milk of Magnesia 8% oral suspension: 30 milliliter(s) orally once a day (at bedtime), Status: Active, Quantity: 0, Refills: None   Duragesic-100 100 mcg/hr transdermal film, extended release: 2 PATCH transdermal every 72 hours along with fentanyl 75 mcg patch, Status: Active, Quantity: 20, Refills: None   Tylenol Extra Strength 500 mg oral tablet: 2 tab(s) orally every 6 hours, As Needed - for Pain, Status: Active, Quantity: 0, Refills: None   fentaNYL 75 mcg/hr transdermal film, extended release: 1 patch transdermal every 72 hours along with two fentanyl 100 mcg patches, Status: Active, Quantity: 10, Refills: None   rOPINIRole 0.5 mg oral tablet: 1 tab(s) orally once a day (at bedtime), Status: Active, Quantity: 30, Refills: 3   zolpidem 10 mg oral tablet: 1 tab(s) orally once a day (at bedtime), Status: Active, Quantity: 30, Refills: 3   promethazine 25 mg oral tablet: 1 tab(s) orally 3 times a day, As Needed - for Nausea, Vomiting, Status: Active, Quantity: 45, Refills: None   Milk of Magnesia 8% oral suspension: 15 milliliter(s) orally once a day (at bedtime), Status: Active, Quantity: 0, Refills: None  Laboratory Results:  Routine Chem:  24-Jul-14 04:14   Glucose, Serum  115  BUN  19  Creatinine (comp) 1.24   Sodium, Serum  132  Potassium, Serum 3.7  Chloride, Serum  91  CO2, Serum  35  Calcium (Total), Serum  7.5  Anion Gap  6  Osmolality (calc) 268  eGFR (African American)  54  eGFR (Non-African American)  47 (eGFR values <52mL/min/1.73 m2 may be an indication of chronic kidney disease (CKD). Calculated eGFR is useful in patients with stable renal function. The eGFR calculation will not be reliable in acutely ill patients when serum creatinine is changing rapidly. It is not useful in  patients on dialysis. The eGFR calculation may not be applicable to patients at the low and high extremes of body sizes, pregnant women, and vegetarians.)  25-Jul-14 05:15   Glucose, Serum  83  BUN 18  Creatinine (comp) 1.22  Sodium, Serum  133  Potassium, Serum 4.3  Chloride, Serum  97  CO2, Serum  34  Calcium (Total), Serum  6.7  Anion Gap  2  Osmolality (calc) 267  eGFR (African American)  55  eGFR (Non-African American)  47 (eGFR values <37m/min/1.73 m2 may be an indication of chronic kidney disease (CKD). Calculated eGFR is useful in patients with stable renal function. The eGFR calculation will not be reliable in acutely ill patients when serum creatinine is changing rapidly. It is not useful in  patients on dialysis. The eGFR calculation may not be applicable to patients at the low and high extremes of body sizes, pregnant women, and vegetarians.)  Result Comment - This specimen was collected through an   - indwelling catheter or arterial line.  - A minimum of 563m of blood was wasted prior    - to collecting the sample.  Interpret  - results with caution. CALCIUM - RESULTS VERIFIED BY REPEAT TESTING.  - NOTIFIED OF CRITICAL VALUE  - C/ ANETRA WALKER _0  06-11-13 BY [facility]  - READ-BACK PROCESS PERFORMED.  Result(s) reported on 11 Jun 2013 at 07:17AM.  Routine UA:  24-Jul-14 01:04   Color (UA) Yellow  Clarity (UA) Hazy  Glucose (UA) Negative  Bilirubin (UA) Negative  Ketones (UA)  Negative  Specific Gravity (UA) 1.014  Blood (UA) Negative  pH (UA) 8.0  Protein (UA) 30 mg/dL  Nitrite (UA) Negative  Leukocyte Esterase (UA) Negative (Result(s) reported on 10 Jun 2013 at 01:38AM.)  RBC (UA) 4 /HPF  WBC (UA) 8 /HPF  Bacteria (UA) TRACE  Epithelial Cells (UA) 16 /HPF  Mucous (UA) PRESENT  Hyaline Cast (UA) 38 /LPF  Uric Acid Crystal (UA) PRESENT (Result(s) reported on 10 Jun 2013 at 01:38AM.)  Routine Coag:  24-Jul-14 04:14   Prothrombin  31.5  INR 3.2 (INR reference interval applies to patients on anticoagulant therapy. A single INR therapeutic range for coumarins is not optimal for all indications; however, the suggested range for most indications is 2.0 - 3.0. Exceptions to the INR Reference Range may include: Prosthetic heart valves, acute myocardial infarction, prevention of myocardial infarction, and combinations of aspirin and anticoagulant. The need for a higher or lower target INR must be assessed individually. Reference: The Pharmacology and Management of the Vitamin K  antagonists: the seventh ACCP Conference on Antithrombotic and Thrombolytic Therapy. ChONGEX.5284ept:126 (3suppl): 20N9146842A HCT value >55% may artifactually increase the PT.  In one study,  the increase was an average of 25%. Reference:  "Effect on Routine and Special Coagulation Testing Values of Citrate Anticoagulant Adjustment in Patients with High HCT Values." American Journal of Clinical Pathology 2006;126:400-405.)  25-Jul-14 05:15   Prothrombin  28.7  INR 2.8 (INR reference interval applies to patients on anticoagulant therapy. A single INR therapeutic range for coumarins is not optimal for all indications; however, the suggested range for most indications is 2.0 - 3.0. Exceptions to the INR Reference Range may include: Prosthetic heart valves, acute myocardial infarction, prevention of myocardial infarction, and combinations of aspirin and anticoagulant. The need for a  higher or lower target INR must be assessed individually. Reference: The Pharmacology and Management of the Vitamin K  antagonists: the seventh ACCP Conference on Antithrombotic and Thrombolytic Therapy. ChXLKGM.0102ept:126 (3suppl): 20N9146842A HCT value >55% may artifactually increase the PT.  In one study,  the increase was an average of 25%. Reference:  "Effect on Routine and  Special Coagulation Testing Values of Citrate Anticoagulant Adjustment in Patients with High HCT Values." American Journal of Clinical Pathology 2006;126:400-405.)  Routine Hem:  24-Jul-14 04:14   WBC (CBC) 5.3  RBC (CBC)  2.68  Hemoglobin (CBC)  7.7  Hematocrit (CBC)  23.7  Platelet Count (CBC) 216  MCV 89  MCH 28.8  MCHC 32.5  RDW  19.1  Neutrophil % 70.8  Lymphocyte % 8.6  Monocyte % 5.8  Eosinophil % 14.1  Basophil % 0.7  Neutrophil # 3.7  Lymphocyte #  0.5  Monocyte # 0.3  Eosinophil # 0.7  Basophil # 0.0 (Result(s) reported on 10 Jun 2013 at 04:49AM.)  25-Jul-14 05:15   WBC (CBC) 4.8  RBC (CBC)  2.70  Hemoglobin (CBC)  7.8  Hematocrit (CBC)  24.1  Platelet Count (CBC) 199  MCV 89  MCH 28.8  MCHC 32.2  RDW  18.7  Neutrophil % 62.0  Lymphocyte % 11.5  Monocyte % 7.4  Eosinophil % 17.7  Basophil % 1.4  Neutrophil # 3.0  Lymphocyte #  0.5  Monocyte # 0.4  Eosinophil #  0.8  Basophil # 0.1 (Result(s) reported on 11 Jun 2013 at 07:17AM.)   Radiology Results:  XRay:    23-Jul-14 21:14, KUB - Kidney Ureter Bladder  KUB - Kidney Ureter Bladder   REASON FOR EXAM:    NG placement  COMMENTS:       PROCEDURE: DXR - DXR KIDNEY URETER BLADDER  - Jun 09 2013  9:14PM     RESULT:     Findings: NG tube is appreciated with tip projecting at the level of the   upper mid abdomen just below the level of the diaphragms. Advancement of   3 to 4 cm is recommended. Air within mild to moderately dilated loops of   large bowel is appreciated. The visualized bony skeleton is  unremarkable.    IMPRESSION:    1. NG tube as described above. Advancement is recommended.  Thank you for the opportunity to contribute to the care of your patient.           Verified By: Mikki Santee, M.D., MD  LabUnknown:  PACS Image    Assessment and Plan: Impression:   Abdominal distention secondary to peritoneal carcinomatosis and partial bowel obstruction.  Patient has recurrent ovarian cancer with previous multiple chemotherapy program.  Patient has been referred to hospice care.of discharging her go home versus hospice home and continuing palliative careof Reglan and  and laxative  as outpatient.  I will follow this patient as outpatient appointment has been made in on Monday to see me  Electronic Signatures: Jobe Gibbon (MD)  (Signed 25-Jul-14 07:49)  Authored: HISTORY OF PRESENT ILLNESS, ROS, NURSING NOTES, PE, PAST MEDICAL HISTORY, ALLERGIES, HOME MEDICATIONS, LABS, OTHER RESULTS, ASSESSMENT AND PLAN   Last Updated: 25-Jul-14 07:49 by Jobe Gibbon (MD)

## 2015-03-10 NOTE — Consult Note (Signed)
History of Present Illness:  Reason for Consult Patient with ovarian cancer with recurrent SBO   HPI   Wanda Mann is a 64 yo woman with PMH of stage IV ovarian cancer (dx 2010) s/p hysterectomy with BSO and omentectomy wth multiple rounds of chemotx (last tx 05/05/13), inflammatory arthritis, ACOD requiring transfusions, h/o constipation, LLE DVT, tubal ligation, knee sgy. Pt was just hospitalized 6/25-6/30/14 with partial SBO.  Review of Systems:  General weakness   Performance Status (ECOG) 2   HEENT no complaints   Lungs no complaints   Cardiac no complaints   GI nausea  vomiting  resolved after NG tube placed   GU no complaints   Musculoskeletal no complaints   Extremities no complaints   Skin no complaints   Neuro no complaints   Endocrine no complaints   Psych no complaints   Physical Exam:  General well appearing female in no acute distress   HEENT: NG tube in place   Lungs: clear   Cardiac: regular rate, rhythm   Breast: not examined   Abdomen: soft  distended  scant bowel sounds   Skin: intact   Extremities: No edema, rash or cyanosis   Neuro: AAOx3   Psych: normal appearance    PCN: Hives  Morphine: GI Distress  Contrast dye: GI Distress  MiraLax: Other  Assessment and Plan: Impression:   Wanda Mann is a 64 yo woman with PMH of stage IV ovarian cancer (dx 2010) s/p hysterectomy with BSO and omentectomy wth multiple rounds of chemotx (last tx 05/05/13), inflammatory arthritis, ACOD requiring transfusions, h/o constipation, LLE DVT, tubal ligation, knee sgy. Pt was just hospitalized 6/25-6/30/14 with partial SBO. She was readmitted 05/20/13 with same. At present, pt is lying in bed, NG in place. Pt says n/v resolved with placement of NG.  She is on her chronic pain meds (fentanyl patch 295mcg/72 hrs). She is also getting hydromorphone IV prn (8mg /12hrs). She says that pain is controlled.   Plan:   1.Partial SBO- due to metastatic ovarian ca. Resolving  on bowel rest with NG tube and IV fluidscancer- has had multiple previous chemo regimens- will see DR Oliva Bustard next week to discuss further.control- good on current regimen.IV supplementwill benefit from IV Zometa as increased calcium can also decrease bowel motility.follow with you.  Electronic Signatures: Georges Mouse (MD)  (Signed 03-Jul-14 13:58)  Authored: HISTORY OF PRESENT ILLNESS, ROS, PE, ALLERGIES, HOME MEDICATIONS, ASSESSMENT AND PLAN   Last Updated: 03-Jul-14 13:58 by Georges Mouse (MD)

## 2015-03-10 NOTE — Consult Note (Signed)
PATIENT NAME:  Wanda Mann, Wanda Mann MR#:  [id number] DATE OF BIRTH:  10/17/1951  DATE OF CONSULTATION:  05/12/2013  REFERRING PHYSICIAN:   CONSULTING PHYSICIAN:  Simonne Come. Inez Pilgrim, MD  HISTORY OF PRESENT ILLNESS:  Wanda Mann is a 64 year old patient known to me and primarily followed in the [facility] by Dr. Oliva Bustard.  She was admitted earlier today with increasing abdominal pain, nauseous, but no vomiting until late yesterday.  She had loose stools x 1 last night.  She chronically has pain in the lower quadrant, but she has had new pain in the upper quadrant and was bloated.  No shortness of breath.  No headache, dizziness, chills, sweats, fever, cough, dysuria or new bone pain.  She was discovered to have some left hydroureter and distended loop of bowel versus a new mass in the left lower quadrant and some partial bowel obstruction.  Has also been evaluated by Surgery   PAST MEDICAL HISTORY:  For ovarian and peritoneal cancer, chemotherapy of multiple regimens in the past including Taxol, carboplatin, as initial treatment back in 2010, later Gemzar, then Doxil and Adriamycin and FOLFOX VP 16 and 5-FU infusion most recently in December 2013, Cytoxan, currently on Cytoxan intravenously every three weekly with five days of Neupogen following.  Last treatment was one week ago so today is day 8 of the current cycle.  Additional past medical history has included inflammatory arthritis on methotrexate, anemia, required transfusions, hypothyroidism and left lower extremity DVT on Coumadin maintenance.  PAST SURGICAL HISTORY:  Also includes tubal ligation, hysterectomy, omentectomy and BSO and knee surgery.   ALLERGIES:  MIRALAX, MORPHINE AND PENICILLIN, BUT ALSO TO CONTRAST DYE, WHICH CAUSES HIVES.  MEDICATIONS AT THE TIME OF ADMISSION:  Included Duke's mouthwash as needed, Phenergan 25 mg 3 times daily as needed, tramadol 50 mg daily as needed, Ambien 10 mg at night, fentanyl 225 mcg patch every three days, Norco  5/325 every 6 hours as needed, Lasix 40 twice daily, iron twice a day, duloxetine 60 mg daily, omeprazole 20 mg daily, Zofran 4 mg q. 6 hours as needed, amitriptyline 25 mg daily, prednisone 5 mg daily, Premarin daily, Synthroid 50 mcg daily, folic acid 1 mg daily, methotrexate 10 mg once a week on Fridays, Coumadin 4 mg by mouth daily.   SOCIAL HISTORY:  No alcohol or tobacco.   FAMILY HISTORY:  As previously reviewed in the [facility], there was pancreatic and kidney cancer in the family.   REVIEW OF SYSTEMS:   See above, also note weakness, no headache, visual disturbances.  No ear or jaw pain.  No cough.  No wheezing.  No palpitations or retrosternal chest pain.  Has abdominal bloating, still has abdominal pain and some tenderness, reports that since admission she has not vomited.  She has not had diarrhea.  She is passing flatus, but no bowel movement since admission.  No dysuria or hematuria.  No hot or cold intolerance.  She has arthritic chronic pains.  No focal weakness.   PHYSICAL EXAMINATION: GENERAL:  Alert, cooperative.  Some pallor.  No jaundice.  HEENT:  Sclerae clear.  MOUTH:  No thrush.  LYMPHATIC:  No palpable lymph nodes in the neck, supraclavicular, submandibular or axilla.  LUNGS:  Clear.  No wheezing or rales.  HEART:  Regular.  ABDOMEN:  Tympanitic and distended with tenderness.  No rigidity.  No rebound.  EXTREMITIES:  There is no lower extremity edema, cyanosis.  There is no rash.  NEUROLOGIC:  Grossly nonfocal, moving  all extremities against gravity.  Alert and cooperative.   LABORATORY DATA:  On admission hemoglobin was 9.2, platelets 235, white count 5.3.  Creatinine 1.26, potassium 2.9, potassium bolus and intravenous potassium replacement has been given.  Alk phos was 329, bilirubin was 0.4.  PT/INR was elevated at 4.9.  Lipase was normal.  Abdominal ultrasound showed a possible right upper quadrant mass, subcapsular.  CT scan shows an old density in this area.   No change.  It shows a left lower quadrant mass that may be tumor mass or fluid filled loops of bowel with some distended loops of bowel, small bowel obstruction pattern and the left hydroureter and hydronephrosis.   IMPRESSION AND PLAN:  A patient with mild chronic anemia.  It is day 8 of cycle of chemotherapy is not demonstrating so far any marrow suppression from chemotherapy.  There is no sign of infection.  Has a small bowel obstruction pattern, but is stable.  It has been evaluated by surgery.  Will be followed by surgery.  There is likely increased intra-abdominal disease and possibly new pelvic mass, but this could also just be a loop of bowel.  However, it would be unusual for that to cause hydroureter, hydronephrosis.  Her creatinine is currently unremarkable.  Also, the Pro Time is elevated.   PLAN:  Fluid and electrolyte replacement as managed by medicine.  No signs of infection.  Surgery will follow as well.  Serial creatinine and potassium and reviewing the CA-125 will be key.  I think we will later need a repeat CT scan with oral contrast.  Would also want to have urology evaluation as well.  Currently no significant changes in regular medication.  Next, chemotherapy is not due for two weeks.  Repeat x-ray and serial tumor markers will tell us if she is still a success or a Cytoxan treatment failure, prior to her next cycle of treatment being due.  Daily Pro Time and resume a lower appropriate dose of Coumadin when steady.     ____________________________ Simonne Come Inez Pilgrim, MD rgg:ea D: 05/12/2013 23:51:00 ET T: 05/13/2013 00:32:36 ET JOB#: [id number]  cc: Simonne Come. Inez Pilgrim, MD, <Dictator> Dallas Schimke MD ELECTRONICALLY SIGNED 06/22/2013 9:30

## 2015-03-10 NOTE — Consult Note (Signed)
Patient seen, chart reviewed, films reviewed, note dictated.  Assessment: Left hydronephrosis, left pelvic mass, metastatic ovarian cancer  Recommendation: The mass in the left pelvis is consistent with progressive ovarian cancer.  This is at the site of the previous ovary resection.  It is causing some compression on the ureter.  The renal function however is stable.  She is not having any left flank discomfort.  This is not the source of her abdominal pain.  She will likely need stent placement at some point in the future.  There is no indication however for any urological intervention at present.  Monitoring of the renal function is all that is indicated.  If she does develop left-sided flank pain with rising serum creatinine, stent placement at that time can be further considered.  With the rate of ovarian cancer progression since February despite chemotherapy administration, the overall outlook is poor.  The findings and situation have been discussed with the patient and her family.  Monitoring is all that is indicated at present.  I will follow during the hospitalization.  If there are any changes, we will proceed as indicated.  If there are any further questions, please free to contact us.  Electronic Signatures: Murrell Redden (MD)  (Signed on 26-Jun-14 16:32)  Authored  Last Updated: 26-Jun-14 16:32 by Murrell Redden (MD)

## 2015-03-10 NOTE — H&P (Signed)
PATIENT NAME:  Wanda Mann, VINSANT MR#:  [id number] DATE OF BIRTH:  1951/06/01  DATE OF ADMISSION:  05/12/2013  ADMITTING PHYSICIAN:  Gladstone Lighter, MD   PRIMARY CARE PHYSICIAN AND PRIMARY ONCOLOGIST: Forest Gleason, MD  CHIEF COMPLAINT: Abdominal pain, nausea and vomiting.   HISTORY OF PRESENT ILLNESS: Wanda Mann is a 64 year old Caucasian female with past medical history significant for stage IV ovarian cancer diagnosed in 2010, status post progressive disease in spite of multiple chemotherapy medications. Currently on Cytoxan every three weeks since December 2013. She comes to the hospital secondary to the above-mentioned complaints. The patient's last dose of Cytoxan was one week ago, and since then she usually feels weak after her chemo, which she has been feeling. She has been nauseous, but no vomiting up until late yesterday when she started multiple episodes of vomiting and diarrhea last night and her chronic  abdominal pain is in the lower quadrant, but  yesterday she had that right upper quadrant pain, feels that her abdomen is bloated and distended, so presents to the ER. Denies any fevers or chills. She is found to be hypokalemic and also a possible right upper quadrant subscapular liver mass on the ultrasound and is being admitted for the same.    PAST MEDICAL HISTORY: 1.  Stage IV ovarian cancer diagnosed in 2010, status post progression of disease in spite of multiple chemotherapies. She is currently on Cytoxan every three weeks and last chemotherapy received on 05/05/2013.  2.  Inflammatory arthritis.  3.  Anemia of chronic disease requiring blood transfusions in the past.  4.  Hypothyroidism.  5.  Peritoneal carcinomatosis.  6.  Constipation.  7.  Left lower extremity DVT.    PAST SURGICAL HISTORY: 1.  Tubal ligation.  2.  Hysterectomy.  3.  Bilaterally salpingo-oophorectomy and omentectomy.  4.  Knee surgery.  ALLERGIES TO MEDICATIONS:  1.  THE PATIENT IS ALLERGIC TO CONTRAST DYE  WHICH CAUSES HIVES.  2.  MIRALAX.  3.  MORPHINE.  4.  PENICILLIN.   Current home medications.  1.  [facility]  mouthwash 5 mL 4 times a day as needed.  2.  Promethazine 25 mg 3 times a day as needed.  3.  Tramadol 15 mg p.o. daily as needed.  4.  Ambien 10 mg p.o. at bedtime.  5.  Ropinorole 0.5 mg p.o. daily.  6.  Fentanyl patch 275 mcg q. 3 days.  7.  Norco 5/325 mg, 1 tablet q. 6 hours p.r.n.  8.  Lasix 40 mg p.o. b.i.d.  9.  Tandem iron supplements twice a day.  10.  Duloxetine 60 mg p.o. daily.  11.  Omeprazole 20 mg p.o. daily.  12.  Zofran 4 mg p.o. q. 6 hours p.r.n.  13.  Amitriptyline 25 mg p.o. daily.  14.  Prednisone 5 mg p.o. daily.  15.  Premarin tablet 0.625 mg daily.  16.  Levothyroxine 50 mcg p.o. daily.  17.  Folic acid 1 mg p.o. daily.  18.  Methotrexate 10 mg tablet once a week on Fridays.  19.  Milk of magnesia 30 mL daily.  20.  Coumadin 4 mg p.o. daily.   SOCIAL HISTORY: Lives at home and has a female companion who helps her.  No history of any smoking or alcohol use. Usually uses a cane to get the long and also has a walker.   FAMILY HISTORY: Cancer runs in the family. Sister with pancreatic cancer and mom with metastatic kidney cancer.   REVIEW OF  SYSTEMS:  CONSTITUTIONAL: No fever. Positive for fatigue and weakness.  EYES: No blurred vision, double vision, glaucoma or cataracts.  ENT: No tinnitus, ear pain, hearing loss or discharge. Positive for some epistaxis. No dysphagia. LUNGS: No cough, wheeze, hemoptysis or chronic obstructive pulmonary disease.  CARDIOVASCULAR: No chest pain, orthopnea, edema, arrhythmia, palpitations or syncope.  GASTROINTESTINAL: Positive for nausea, vomiting and diarrhea and also abdominal pain. No hematemesis or melena.  GENITOURINARY: No, dysuria, hematuria, renal calculus, frequency or incontinence.  ENDOCRINE: No polyuria, nocturia, thyroid problems, heat or cold intolerance.  HEMATOLOGY: Positive for anemia, easy bruising and  bleeding present secondary to being on Coumadin.  MUSCULOSKELETAL: Positive for small joint pain secondary to her arthritis. No gout.  NEUROLOGIC: No numbness, weakness, CVA, transient ischemic attack or seizures.  PSYCHOLOGICAL: No anxiety, insomnia, depression.   PHYSICAL EXAMINATION: VITAL SIGNS: Temperature 98.5 degrees Fahrenheit, pulse 83, respirations 20, blood pressure 107/70, pulse oximetry 97% on room air.  GENERAL: Well-built, well-nourished female lying in bed not in any acute distress.  HEENT: Normocephalic atraumatic. Pupils equal, round reacting to light, anicteric sclerae, extraocular movements intact. Oropharynx is clear without erythema mass or exudates.  NECK: Supple no thyromegaly JVD or carotid bruits. No lymphadenopathy.  LUNGS: Moving air bilaterally. No wheeze or crackles.  No use of accessory muscles for breathing.  CARDIOVASCULAR: S1, S2 regular rate and rhythm, 3/6 systolic murmur heard. No rubs or gallops.  ABDOMEN: Soft but appears distended. Tenderness with voluntary guarding in the right upper quadrant and both lower quadrants. No rigidity or rebound tenderness. Hypoactive bowel sounds are present.  EXTREMITIES: No pedal edema. No clubbing or cyanosis, 2+ dorsalis pedis pulses palpable bilaterally.  SKIN: No acne, rash or lesions.  LYMPHATICS: No cervical lymphadenopathy.  NEUROLOGIC: Cranial nerves are intact,  2+ deep tendon reflexes symmetric both upper and lower extremities and strength is equal 5/5 in all four extremities. Sensation is intact.  PSYCHOLOGICAL: The patient is awake, alert, oriented x 3.   LABORATORY, DIAGNOSTIC AND RADIOLOGIC DATA: WBC 5.3, hemoglobin 9.2, hematocrit 27.7, platelet count 235.   Sodium 134, potassium 2.9, chloride 88, bicarbonate 42, BUN 24, creatinine 1.26, glucose 121 and calcium of 8.3.   ALT 28, AST 24, alkaline phosphatase 329, total bilirubin 0.4 and albumin of 3.5. INR is 4.9. Lipase is 35.   Abdominal ultrasound  showing indeterminate mass along the anterior aspect of the right lobe of liver. It is subcapsular in location with evidence of subcapsular traction. No sonographic evidence of cholecystitis noted.   EKG showing normal sinus rhythm, heart rate of 72.   ASSESSMENT AND PLAN: A 64 year old female with past medical history significant for stage IV progressive ovarian carcinoma in spite of multiple chemotherapies, inflammatory arthritis, who is currently on Cytoxan every three weeks. The last chemo was received about a week ago,  presents secondary to worsening nausea, vomiting and right upper quadrant abdominal pain.  1.  Abdominal pain with nausea and vomiting. This could be early bowel obstruction. Ultrasound of the abdomen revealing possible liver mass.  We will do a CT of the abdomen without contrast. Contrast is not being given due to her allergy, as well as renal function. I also ordered a CEA-125. If the CT shows any obstruction, we will call surgery.  We will start her on clear liquid diet, IV fluids, pain and nausea medications.  2.  Hypokalemia. Likely from gastrointestinal losses and also on Lasix. Hold Lasix for now. Potassium will supplemented and recheck potassium.  3.  Stage IV ovary and cancer diagnosed in 2010 with progressive disease, in spite of multiple chemotherapies. She is currently on Cytoxan, started in December 2013 every three weeks. Her last dose was about a week ago. Reordered CEA 125, as there is concern for progression of disease. The patient's primary oncologist is Dr. Oliva Bustard and Dr. Inez Pilgrim aware of the consult, as Dr. Oliva Bustard is on vacation.   4.  Chronic pain medications. Continue her home meds.  5.  Supra-therapeutic INR. The patient is taking Coumadin secondary to deep vein thrombosis. We will hold Coumadin at this time and recheck INR in morning.  6.  Gastrointestinal and deep vein thrombosis prophylaxis. On Protonix and her INR is supra-therapeutic anyways.   CODE STATUS:  Full code at this time.   TIME SPENT ON ADMISSION: 50 minutes.    ____________________________ Gladstone Lighter, MD rk:cc D: 05/12/2013 14:12:36 ET T: 05/12/2013 15:21:30 ET JOB#: [id number]  cc: Gladstone Lighter, MD, <Dictator> Janak K. Oliva Bustard, MD Gladstone Lighter MD ELECTRONICALLY SIGNED 06/14/2013 13:14

## 2015-03-10 NOTE — Consult Note (Signed)
64 yr old female with h/o metastatic ovarian cancer admitted to sx for colonic pseudo obstruction. Medicine is consulted for blackout spell  syncope prob from dehydration : IVF/orthostatics/ am labs/  tele. Further w/u if no improvement HYPOCHLOREMIA/ prob from dehydration/VOMITING= IVF COLONIC pseudoobstruction PER SX ovarian cancer  5. h/o DVT on coumadin - INR 3 is POA spent is 45 min [id number]   Electronic Signatures: Nicholes Mango (MD) (Signed on 24-Jul-14 01:14)  Authored   Last Updated: 24-Jul-14 07:54 by Nicholes Mango (MD)

## 2015-03-10 NOTE — Consult Note (Signed)
Chief Complaint:  Subjective/Chief Complaint NO ACUTE COMPLAINTS, MORE FLATUS, PAIN RESOLVED TOLERATED FLUIDS NO NAUSEA   VITAL SIGNS/ANCILLARY NOTES: **Vital Signs.:   27-Jun-14 14:04  Vital Signs Type Routine  Temperature Temperature (F) 96.1  Celsius 35.6  Temperature Source AdultAxillary  Pulse Pulse 87  Respirations Respirations 20  Systolic BP Systolic BP 536  Diastolic BP (mmHg) Diastolic BP (mmHg) 77  Mean BP 88  Pulse Ox % Pulse Ox % 92  Pulse Ox Activity Level  At rest  Oxygen Delivery Room Air/ 21 %   Brief Assessment:  Cardiac Regular   Respiratory normal resp effort   Gastrointestinal details normal Soft  Nontender   Additional Physical Exam neuro non focal alert and cooperati ve, pallor no cough no wheeze   Lab Results: Routine Chem:  27-Jun-14 05:43   Result Comment WBC - RESULTS VERIFIED BY REPEAT TESTING.  - CRITICAL VALUE PREVIOUSLY NOTIFIED.  - TPL  Result(s) reported on 14 May 2013 at 06:40AM.  Glucose, Serum  47  BUN 16  Creatinine (comp) 0.80  Sodium, Serum 138  Potassium, Serum 4.7  Chloride, Serum 107  CO2, Serum 24  Calcium (Total), Serum  7.6  Anion Gap 7  Osmolality (calc) 274  eGFR (African American) >60  eGFR (Non-African American) >60 (eGFR values <19m/min/1.73 m2 may be an indication of chronic kidney disease (CKD). Calculated eGFR is useful in patients with stable renal function. The eGFR calculation will not be reliable in acutely ill patients when serum creatinine is changing rapidly. It is not useful in  patients on dialysis. The eGFR calculation may not be applicable to patients at the low and high extremes of body sizes, pregnant women, and vegetarians.)    07:31   Result Comment INR - RESULTS VERIFIED BY REPEAT TESTING.  - NOTIFIED OF CRITICAL VALUE  - RESULT C/HEATHER HAWKINS @08 :30  - 0N 05/14/13 BY EFF  - READ-BACK PROCESS PERFORMED.  Result(s) reported on 14 May 2013 at 08:29AM.  Routine Coag:  27-Jun-14 07:31    Prothrombin  40.8  INR  4.5 (INR reference interval applies to patients on anticoagulant therapy. A single INR therapeutic range for coumarins is not optimal for all indications; however, the suggested range for most indications is 2.0 - 3.0. Exceptions to the INR Reference Range may include: Prosthetic heart valves, acute myocardial infarction, prevention of myocardial infarction, and combinations of aspirin and anticoagulant. The need for a higher or lower target INR must be assessed individually. Reference: The Pharmacology and Management of the Vitamin K  antagonists: the seventh ACCP Conference on Antithrombotic and Thrombolytic Therapy. CUYQIH.4742Sept:126 (3suppl): [id number] A HCT value >55% may artifactually increase the PT.  In one study,  the increase was an average of 25%. Reference:  "Effect on Routine and Special Coagulation Testing Values of Citrate Anticoagulant Adjustment in Patients with High HCT Values." American Journal of Clinical Pathology 2006;126:400-405.)  Routine Hem:  27-Jun-14 05:43   WBC (CBC)  0.9  RBC (CBC)  2.46  Hemoglobin (CBC)  7.6  Hematocrit (CBC)  23.1  Platelet Count (CBC) 188  MCV 94  MCH 31.1  MCHC 33.0  RDW  20.0  Neutrophil % 55.9  Lymphocyte % 16.4  Monocyte % 15.4  Eosinophil % 8.8  Basophil % 3.5  Neutrophil #  0.5  Lymphocyte #  0.2  Monocyte #  0.1  Eosinophil # 0.1  Basophil # 0.0   Assessment/Plan:  Assessment/Plan:  Assessment PROGRESSIVE OVARIAN CANCER  CA 125 IS 667  APPRECIATE SURGERY NOTE. HAVE DISCUSSED WITH SURGERY  APPRECIATE UROLOGY NOTE.  NEUTROPHILS LOWER TODAY, EFFECT OF PRIOR CHEMOTX, NOW ABSOLUTE NEUTROPENIA. NO FEVER. PROTIME STILL HIGH AT 4.5, DUE TO POOR NUTRITIONAL STATUS, NO BLEEDING   Plan F/U CBC.  START NEUPOGEN DAILY EVEN IN ABSENCE OF FEVER WITH HIGH RISK . HOLD COUMADIN. CHECK DAILY PROTIME, WHEN INR LESS THAN 2.4 WOULD RESTART COUMADIN CAUTIOUSLY, WILL HAVE HYPERSENSITIVITY TO COUMADIN, WHICH MAY  LESSEN IF RESUMES NORMAL DIET. IF POSSIBLE ACUTE INTERVENTIONS OR SURGERY PLANNED, HAVE CONSIDERED LOVENOX INSTEAD OF RESUMING COUMADIN.    DR PANDIT WILL F/U IN MY ABSENCE FOR HEME ONC ISSUES   Electronic Signatures: Dallas Schimke (MD)  (Signed 27-Jun-14 17:26)  Authored: Chief Complaint, VITAL SIGNS/ANCILLARY NOTES, Brief Assessment, Lab Results, Assessment/Plan   Last Updated: 27-Jun-14 17:26 by Dallas Schimke (MD)

## 2015-03-10 NOTE — Consult Note (Signed)
ONCOLOGY followup note - still weak and resting in bed, states that abdominal pain is slowly improving.  Passing some flatus.  Trying liquids.  No active vomiting.no fever or chills.  Denies bleeding symptoms.weak, A, O x 3, NAD          vitals - afebrile, stable.          lungs - bilateral good breath sounds, no rhonchi          abd - soft, nondistended, tender in lower quadrants. hemoglobin 7.7, platelets 209, WBC 1300, ANC 800, PT/INR 6.2. Progressive Ovarian cancer admitted with PSBO - symptoms slowly improving, management per surgeon.  No acute pain issues.  Further cancer treatment on hold acute issues improve/resolve.Neutropenia - secondary to recent chemotherapy effect.  ANC slightly better at 800 but still low, will give Neupogen 300 mcg SQ x 1 dose today, continue to monitor CBC.  Afebrile.Anticoagulation - INR 6.2,  no obvious bleeding symptoms.  Continue to hold Coumadin and monitor.  If she develops bleeding issues, recommend rapid reversal of Coumadin effect with fresh frozen plasma and/or vitamin K.continue to follow.  Patient and family present explained above, they are agreeable to this plan.   Electronic Signatures: Jonn Shingles (MD) (Signed on 28-Jun-14 17:38)  Authored   Last Updated: 28-Jun-14 17:39 by Jonn Shingles (MD)

## 2015-03-10 NOTE — Consult Note (Signed)
ONCOLOGY followup note - states that abdominal pain is better, had BMs today. Tolerating liquids.  No vomiting.no fevers.  Denies bleeding symptoms.weak, A, O x 3, NAD          vitals - afebrile, stable.          lungs - bilateral good breath sounds, no rhonchi          abd - soft, nondistended, tender in lower quadrants. hemoglobin 8.4, platelets 256, WBC 2100, ANC 1400, Cr 0.89, PT/INR 5.2. Progressive Ovarian cancer admitted with PSBO - symptoms improving, management per surgeon.  No acute pain issues.  Further cancer treatment on hold till acute issues improve/resolve.Neutropenia - secondary to recent chemotherapy effect.  ANC improved, 1400 today.  Afebrile.Anticoagulation - INR lower at 5.2,  no obvious bleeding symptoms.  Continue to hold Coumadin and monitor.  If she develops bleeding issues, recommend rapid reversal of Coumadin effect with fresh frozen plasma and/or vitamin K.continue to follow.  Patient and family present explained above, they are agreeable to this plan.  Electronic Signatures: Jonn Shingles (MD)  (Signed on 29-Jun-14 21:13)  Authored  Last Updated: 29-Jun-14 21:13 by Jonn Shingles (MD)

## 2015-03-10 NOTE — Discharge Summary (Signed)
PATIENT NAME:  Wanda Mann, CHIASSON MR#:  [id number] DATE OF BIRTH:  10/19/1951  DATE OF ADMISSION:  05/20/2013  DATE OF DISCHARGE:  05/30/2013  DISCHARGE DIAGNOSES: 1.  Recurrent small bowel obstructions, multiple surgery with ileotransverse anastomosis.  2.  Left leg deep vein thrombosis.  3.  Stage IV ovarian cancer with peritoneal carcinomatosis.  4.  Inflammatory  arthritis.  5.  Anemia of chronic disease.  6.  Hypothyroidism.  7.  Restless legs  syndrome.  8.  Chronic pain syndrome due to cancer.  9.  Healthcare-associated pneumonia.  10.  Depression.  DISCHARGE MEDICATIONS: 1.  Amitriptyline 25 mg p.o. daily. 2.   Zofran 4 mg every 6 hours as needed for nausea 4.   Cymbalta 60 mg daily.  5.   Premarin 0.625 mg p.o. daily.  6.   Levothyroxine 50 mcg p.o. daily. 7.   Folic acid 1 mg p.o. daily. 8.   Methotrexate 2.5 mg 4 tablets once a week.  9.   Tandem 162/115.2 mg 1 capsule p.o. b.i.d.  10.  patient is on 275 mcg Duragesic patch every 72 hours.  11.  Requip 0.5 mg daily.  12.  Tramadol 50 mg every day as needed for pain.  13.  Phenergan 25 mg p.o. t.i.d. p.r.n. for nausea. 14.   Lasix 20 mg daily. 15.  Coumadin 5 mg p.o. daily; patient needs PT-INR monitoring and adjusting the Coumadin. 16.   Ensure 200 mL 1 can p.o. t.i.d.   DIET:  Low sodium diet.   CONSULTATIONS:  Oncology consult, Dr. Oliva Bustard. Palliative Care consult, Dr. Ermalinda Memos. Surgical consult, Dr. Jamal Collin.   HOSPITAL COURSE: 1.  Small bowel obstruction. A 64 year old female patient who was recently discharged on June 30, came in with abdominal pain, nausea, vomiting, unable to tolerate anything. CAT scan of the abdomen showed a small bowel obstruction with left hydronephrosis and hydroureter, and also a left pelvic mass. The patient's lab data showed a BUN of 12, creatinine 1.14, potassium 3.3, white count 7.5 on admission. She had tenderness in the epigastric region, so she was admitted to the hospitalist service for small  bowel obstruction and she was kept n.p.o. NG tube was continued. She kept on NG tube to LIS, and IV fluids were started, IV PPIs and IV pain medications and IV nausea medications were started. Seen by Surgery Dr. Jamal Collin. He saw the patient and recommended NG tube to LIS, so we have continued the NG tube to LIS for 48 hours. After that, we started clear liquids. The patient's nausea and also abdominal pain recurred, so we have stopped the clear liquids. Dr. Jamal Collin reevaluated the patient, and patient was taken to the [facility] on July 8. The patient had a small bowel obstruction with recurrent ovarian cancer as a  preop diagnosis, and postop patient had a laparotomy, ileotransverse anastomosis. He also found a pelvic mass with obstruction in the ileocecal region. The patient tolerated the procedure well, and we had kept the NG tube to LIS almost 24 to 48 hours. After that, patient started to have  small flatus, and also denied any nausea, vomiting. The NG tube did not reveal any drainage, so  NG tube was discontinued, started on clear liquids. The patient tolerated the clear liquids, so we advanced the diet to a regular diet yesterday, and she is tolerating the diet. The patient was seen by Dr. Richardson Landry, who is covering for Dr. Jamal Collin, and he suggested that patient can be discharged and she can follow  up with Dr. Jamal Collin on July 17 for follow up and suture removal.  2.  Left leg deep vein thrombosis. The patient initially was on Lovenox, in case if she needed surgery. She was on Coumadin at home, but withheld it and started the Lovenox. The patient's INR was 2.1 on admission, and then dropped to below 2, but at the time of surgery, before the day, the patient returned to Coumadin because she was tolerating clear liquids, but Dr. Jamal Collin said patient  needed to go to surgery, so we have reversed the INR effect with vitamin K, and then after the surgery restart the Lovenox and Coumadin. Right now, patient is on Lovenox and  Coumadin, but the INR is 2. I am going to stop the Lovenox, and patient can continue 5 mg of Coumadin, and then patient can follow up with Dr. Oliva Bustard for management of Coumadin dosing.  3.  Healthcare-associated pneumonia When she came, she also had trouble breathing and some cough, so patient started on IV vanc and Zosyn. She also was hypoxic when she came, so she started on vanc and meropenem, and patient's chest x-ray showed bilateral pneumonia. After starting the vanc and meropenem, patient's symptoms improved. She was taking prednisone at home for inflammatory arthritis, but during the hospital stay she continued on Solu-Medrol to help her with pneumonia as well. Later on, the patient's symptoms improved, and meropenem was discontinued because patient developed a rash all over the body, and meropenem was promptly discontinued. The patient did receive Benadryl. The rash resolved after starting Benadryl, so meropenem is included in her allergy list. So meropenem discontinued and Levaquin was started. The patient already received a total of 7 days of antibiotics. Her white count is improved, initial white count on admission was six, but later on it crept up to 14 on July 9. The patient was afebrile, but thought to be secondary to steroid effect. The patient is not hypoxic anymore. Lungs are clear. She does not have any more cough. She is saturating around 94% to 95%. She already received vanc and Levaquin, and she can be discharged without further antibiotics, but she can continue home dose of prednisone that they dictated on medication list.   4.  The patient has depression. Continue her home medication.  5.  History of pedal edema. She is on Lasix. Continue that, but the blood pressure looks like it is dropping when started on Lasix, so she can continue 20 mg daily instead of 40 twice daily, so change that on her medication list. The patient has depression and also restless legs syndrome. Continue the  medication.  6.  Stage IV ovarian cancer. She sees Dr. Oliva Bustard. The patient can follow up with them as scheduled. 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 The patient received chemotherapy in 2011 and also 2012.  The patient can see Dr. Oliva Bustard as needed.   7.  The patient was also hypocalcemic, received some calcium replacement during the hospital stay.  Her calcium was 6.4. With correction of albumin, it was 7.5. She did receive 2 grams of calcium gluconate.   The patient is able to ambulate now, and we are going to let her go. The patient will be going home.   Time spent on discharge preparation:  More than 30 minutes.     ____________________________ Epifanio Lesches, MD sk:mr D: 05/30/2013 11:21:57 ET T: 05/30/2013 17:51:40 ET JOB#: [id number]  cc: Epifanio Lesches,  MD, <Dictator> Janak K. Oliva Bustard, MD Mckinley Jewel, MD   Epifanio Lesches MD ELECTRONICALLY SIGNED 06/06/2013 14:13

## 2015-03-10 NOTE — H&P (Signed)
PATIENT NAME:  Wanda Mann, Wanda Mann MR#:  [id number] DATE OF BIRTH:  1951-03-16  DATE OF ADMISSION:  05/20/2013  REFERRING PHYSICIAN:  Dr. Marjean Donna.   PRIMARY ONCOLOGY:  Dr. Oliva Bustard.   CHIEF COMPLAINT:  Abdominal pain, nausea, vomiting.   HISTORY OF PRESENT ILLNESS:  This is a 64 year old Caucasian female with a history of stage IV ovarian cancer diagnosed in 2010, status post progressive disease, status post multiple chemotherapy and radiation, the patient was recently discharged  from [facility] for diagnosis of partial small bowel obstruction which was treated conservatively.  she was tolerating by mouth intake and had no abdominal pain, nausea, vomiting, the patient was at home for the last two days, but at day of presentation she did complain of multiple episodes of vomiting, but denies any coffee-ground emesis, as well had decreased by mouth intake for appetite as well and feeling generally weak, is complaining of significant abdominal pain mainly in the epigastric area as well, as well she reports she had a bowel movement multiple episodes the day of presentation, the patient presented to the ED where she had CT abdomen and pelvis with by mouth contrast which did show contrast in the stomach and only minimal amount in the small bowel, as well some contrast in the esophagus, which might be gastroesophageal reflux disease as well, as well the patient was hypoxic, even though she denies any cough, any fever, any shortness of breath, her CT scan did show evidence of bilateral pneumonia.  Hospitalist service were requested to admit the patient for further management and treatment of her symptoms.   PAST MEDICAL HISTORY: 1.  Stage IV ovarian cancer diagnosed in 2010, status post progression of disease despite of multiple chemotherapies.  2.  Inflammatory arthritis.  3.  Anemia of chronic disease requiring blood transfusion in the past.  4.  Hypothyroidism.  5.  Peritoneal carcinomatosis.  6.   Constipation.  7.  Left lower extremity DVT.  8.  Chronic pain syndrome.  9.  Depression.  10.  Hypothyroidism.  11.  Restless leg syndrome.   PAST SURGICAL HISTORY: 1.  Tubal ligation.  2.  Hysterectomy.  3.  Bilateral salpingo-oophorectomy and omentectomy.  4.  Knee surgery.   ALLERGIES:   1.  MIRALAX.  2.  MORPHINE.  3.  PENICILLIN.   4.  ALLERGY CONTRAST DYE WHICH CAUSES HIVES.   HOME MEDICATIONS:   1.  The patient's warfarin was stopped upon discharge due to supratherapeutic INR, was supposed to be resumed when her INR is at therapeutic range.  2.  Amitriptyline 25 mg at bedtime.  3.  Zofran as needed.  4.  Omeprazole 20 mg oral daily.  5.  Cymbalta 60 mg daily.  6.  Prednisone 5 mg daily.  7.  Premarin 0.625 mg daily.  8.  Levothyroxine 50 mcg at bedtime.  9.  Folic acid 1 mg daily.  10.  Methotrexate 2.5 mg 4 tablets once a week on Friday.  11.  Milk of magnesia 30 mL at bedtime.  13.  Duragesic patch 275 mcg patch every 72 hours which was two patches of 100 mcg each and 75 mcg patch.  14.  Requip 0.5 mg at bedtime.  15.  Ambien 10 mg at bedtime.  16.  Tramadol 50 mg daily as needed.  17.  Promethazine 25 mg 3 times a day as needed.  18.  Tylenol 500 mg 2 tablets every 6 hours as needed.  19.  Ensure 250 mL 3 times  a day.   SOCIAL HISTORY:  Lives at home, has a man companion who helps her.  No history of smoking, alcohol use.   FAMILY HISTORY:  Cancer in the family, sister with pancreatic cancer and mom with metastatic kidney cancer.   REVIEW OF SYSTEMS:  CONSTITUTIONAL:  The patient denies any fever, any chills.  Complains of generalized weakness, fatigue, weight loss.  EYES: Denies blurry vision, double vision, inflammation, glaucoma.  EARS, NOSE, THROAT:  Denies tinnitus, ear pain, epistaxis, discharge.  RESPIRATORY:  Denies cough, wheezing, hemoptysis, dyspnea, COPD.  CARDIOVASCULAR:  Denies chest pain, edema, arrhythmia, palpitations, syncope.   GASTROINTESTINAL:  Complains of nausea, vomiting, abdominal pain.  Denies bright red blood per rectum, coffee-ground emesis, constipation or diarrhea.  GENITOURINARY:  Denies dysuria, hematuria, renal colic.  ENDOCRINE:  Denies polyuria, polydipsia, heat or cold intolerance.  HEMATOLOGY:  Denies anemia, easy bruising, bleeding diathesis.  INTEGUMENTARY:  Denies acne, rash or skin lesions.  MUSCULOSKELETAL:  Denies gout, cramps.  Has history of inflammatory arthritis.  NEUROLOGIC:  Denies CVA, TIA, seizures, dementia, ataxia or tremors.  PSYCHIATRIC:  Denies schizophrenia, substance or alcohol abuse or history of depression.   PHYSICAL EXAMINATION: VITAL SIGNS:  Temperature 97.8, pulse 93, respiratory rate 18, blood pressure 137/89, saturating 94% on oxygen.  GENERAL:  Elderly female who looks comfortable in bed in no apparent distress.  HEENT:  Head atraumatic, normocephalic.  Pupils equal, reactive to light.  Pink conjunctivae.  Anicteric sclerae.  Moist oral mucosa.  NECK:  Supple.  No thyromegaly.  No JVD.  No carotid bruits.  No lymphadenopathy.   LUNGS:  Had good air entry bilaterally.  No wheezing or crackles.  Has bibasilar rales.  No use of accessory muscles.  CARDIOVASCULAR:  S1, S2 heard.  No rubs, murmurs, gallops.  Has mild systolic murmur. ABDOMEN:  Soft, but as well distended, has tenderness, but no rebound, no guarding, has hyperperistaltic bowel sounds in the left abdomen and the rest of the abdomen has hypoactive bowel sounds. EXTREMITIES:  No edema.  No clubbing.  No cyanosis.  +2 dorsalis pedis pulses palpable bilaterally.  SKIN:  No acne, no rash.  No skin lesions.  Warm and dry.  LYMPHATICS:  No cervical lymphadenopathy.  NEUROLOGIC:  Cranial nerves grossly intact.  Motor 5 out of 5.  No focal sensory deficit, +2 deep tendon reflexes and symmetrical.  PSYCHOLOGIC:  The patient is awake, alert x 3.  Intact judgment and insight.   PERTINENT LABORATORY DATA:  Glucose 120,  BUN 12, creatinine 1.14, sodium 133, potassium 3.3, chloride 91, ALT 15, AST 15, alk phos 387, total bili 0.6, total protein 6.3, albumin serum 2.5, white blood cells 7.5, hemoglobin 11.1, hematocrit 33.9, platelets 485.  INR 2.1.   IMAGING STUDIES:  CT abdomen showed persistent changes of small bowel obstruction and left hydronephrosis and hydroureter, possibly secondary to left pelvic mass and bilateral lower lobe pneumonia with a small bilateral pleural effusion and evidence of gastroesophageal reflux disease could be contributing to this and possible small hepatic metastasis.   ASSESSMENT AND PLAN:  This is an unfortunate 64 year old female with history of metastatic ovarian cancer presents with recurrent small bowel obstruction.  1.  Abdominal pain with nausea, vomiting.  This appears to be secondary to partial small bowel obstruction from metastatic ovarian cancer, even though patient did not have any contrast in the distal small bowel or her colon, but she reports she had a bowel movement and she was passing gas today so  it is unlikely due to small bowel obstruction, ED staff discussed with surgery Dr. Enriqueta Shutter on-call who recommended the NG tube on intermittent low suction, so we will proceed with that, we will keep patient nothing by mouth.  We will keep her on appropriate IV fluid hydration.  This unfortunately appears to be recurrent problem with a poor prognosis, discussed with the family and patient will request palliative care consult and we will consult her primary oncologist as well.  2.  Bilateral pneumonia.  The patient was recently hospitalized so we will proceed with treatment for both aspiration pneumonia most likely due to her vomiting and with healthcare-acquired pneumonia.  We will start the patient on vancomycin and meropenem.  3.  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  This is most likely contributing to her  elevated LFT.  4.  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  5.  Left hydroureter and hydronephrosis on CT abdomen, the patient seen by urology during her last hospitalization, at this point there is no immediate intervention needed.  6.  Chronic pain syndrome, due to her cancer burden in her abdomen, we will continue patient on her Duragesic patch which is 275 mcg every 72 hours, as well we will have her on as needed IV Dilaudid, we will consult palliative care to assist [place] with her pain management.  7.  Stage IV ovarian cancer.  We will consult oncology and palliative care.  8.  Deep vein thrombosis, the patient's INR appears to be therapeutic.  We will consult pharmacy to dose warfarin, the patient appears to be sensitive to warfarin as she used to be on 5 mg twice a week prior to her previous admission to [facility] with supratherapeutic INR, so we will try to start with low dose warfarin.  9.  Depression.  We will resume home medications once she is able to tolerate by mouth intake.  10.  Hypothyroidism.  We will resume her Synthroid when she is able to tolerate by mouth intake.  If she is unable, we will switch her dose to IV.  11.  Deep vein thrombosis prophylaxis.  The patient is on anticoagulation secondary to her history of DVT.  We will consult pharmacy to dose warfarin for target INR 2 to 3.  12.  CODE STATUS:  Discussed with patient and her daughter at bedside who is her power of attorney.  The patient is currently DO NOT RESUSCITATE, we will consult palliative care for further recommendation, discussed briefly hospice option with the patient's daughter.   Total time spent on admission and patient care 60 minutes.    ____________________________ Albertine Patricia, MD dse:ea D: 05/20/2013 03:11:54  ET T: 05/20/2013 04:01:04 ET JOB#: [id number]  cc: Albertine Patricia, MD, <Dictator> Lauri Till Graciela Husbands MD ELECTRONICALLY SIGNED 05/21/2013 2:00

## 2015-03-10 NOTE — Consult Note (Signed)
PATIENT NAME:  Wanda Mann, Wanda Mann MR#:  [id number] DATE OF BIRTH:  1951-11-13  DATE OF CONSULTATION:  05/20/2013  CONSULTING PHYSICIAN:  S.G. Jamal Collin, MD  REQUESTING PHYSICIAN: Dr. Leslye Peer   REASON FOR CONSULTATION:  Recurrent small bowel obstruction.   HISTORY: This is a 64 year old female who is known to me from previous encounters. She has stage IV ovarian cancer, which has been treated with chemotherapy and preceded by surgical resection of her ovaries and uterus, in addition to omentectomy performed at the time. The patient has had progressive disease in spite of the chemotherapy, and has somewhat been resistant to treatment over the last several months. The patient also had a left lower extremity DVT, for which she underwent full evaluation, and subsequently placed on anticoagulation. Currently she has been using a venous pump at home to minimize swelling in the leg. She was admitted last week with a complaint of abdominal distention, nausea and vomiting, with the findings suggestive of partial small bowel obstruction on CT, and the presence of a pelvic mass, likely a recurrence of her cancer. She also had a left-sided hydroureter and hydronephrosis, evaluated by Urology and felt it did not require intervention at this time. The patient was treated conservatively with nasogastric decompression followed by initiation of oral intake, which she did well, and was discharged home in stable condition 4 days ago. She had recurrence of the same symptoms of distention, nausea and vomiting, but was also still passing gas and having several bowel movements. All this happened yesterday, and she was readmitted last night via the Emergency Room. After placement of an NG tube, the patient says her belly went down significantly and she is feeling much better.   Her past history and all other additional information from the recent admission and current documentation was reviewed. The patient's Coumadin was stopped at the  time of her discharge because of supratherapeutic levels, and her pro time is now at 22.9, with an INR of 2.1.   PHYSICAL EXAMINATION: GENERAL:  The patient is not in any acute distress. She seems fairly comfortable at this point. NG tube was in place.  NECK:  Supple. No nodes or masses palpable.  LUNGS:  Clear, with diminished sounds at the basis.  ABDOMEN:  Exam reveals some mild abdominal distention, but it soft and nontender at this time. Bowel sounds are hypoactive. No hernias identified. There is minimal edema in the left leg consistent with her recent history of DVT and subsequent postphlebitic syndrome.   CT scan was reviewed, which shows evidence of a pelvic mass and, again, the presence of a left hydroureter and hydronephrosis. There is some moderately dilated small bowel, but there is also air and some stool in the colon. Also noted was evidence of bilateral pneumonia and evidence of reflux as noted on the study, and possible aspiration was cause of her pneumonia.   IMPRESSION/RECOMMENDATIONS: This patient, obviously, is not a prime candidate for surgical intervention, although it is more than likely that she had a partial small bowel obstruction related to her pelvic mass or recurrent disease. It is also possible that this is a pattern of ileus, possibly related to her pneumonia. This has been known to happen at times. Even if surgery is undertaken, it is possible that the patient may not have a permanent solution to her obstruction and may require an intestinal bypass to detour the area of obstruction, as opposed to an actual correction of the problem all told. At this point, I would  feel that surgery is the last option. It certainly is more risky in this individual with the pulmonary situation as is at this point. I recommended that the patient be continued with NG tube, which seems to have controlled her symptoms significantly, and in the interim treat her for the pneumonia and reassess  after several days of conservative management. If after 48 hours, the patient has had no further abdominal symptoms, a trial of clamping the NG tube and minimal oral intake with clear liquids is acceptable. I will be away for the next 3 days, and I will reassess the patient on Monday July 7 on my return. If there are any new developments from the surgical standpoint, the surgeon on call can be contacted for reassessment.   Thank you for allowing me to evaluate and help in the care of this patient.   ____________________________ S.Robinette Haines, MD sgs:mr D: 05/20/2013 17:55:52 ET T: 05/20/2013 19:06:54 ET JOB#: [id number]  cc: Synthia Innocent. Jamal Collin, MD, <Dictator> [facility] Robinette Haines MD ELECTRONICALLY SIGNED 05/21/2013 9:29

## 2015-03-10 NOTE — Consult Note (Signed)
Patient seeen in ED  . Abdominal discomfort relieved with placement of rectal tube.  Extended discussion with patient, boyfried, daughter Jonelle Sidle and sister Narda Rutherford, (both have POAs) about admission to [facility] vs hospital admission.  Patient has accepted Hospice bed for management of issues related stage IV ovarian CA and recurrent ileus and snal sphincter spasm.   Electronic Signatures: Deborra Medina (MD)  (Signed on 26-Jul-14 20:15)  Authored  Last Updated: 26-Jul-14 20:15 by Deborra Medina (MD)

## 2015-03-10 NOTE — Discharge Summary (Signed)
PATIENT NAME:  Wanda Mann, Wanda Mann MR#:  [id number] DATE OF BIRTH:  [birth year]  DATE OF ADMISSION:  05/12/2013 DATE OF DISCHARGE:  05/17/2013  PRIMARY CARE PHYSICIAN: Forest Gleason, MD and Rhett Bannister. Ma Hillock, MD who is covering for Dr. Oliva Bustard while he is on vacation   DISCHARGE DIAGNOSES:  1.  Abdominal pain with nausea, vomiting secondary to a partial small bowel obstruction.  2.  Left hydroureter and hydronephrosis.  3.  Hypokalemia.  4.  Chronic pain.  5.  Stage IV ovarian cancer.  6.  Deep vein thrombosis with coagulopathy.  7.  Depression.  8.  Hypothyroidism.  9.  Restless leg syndrome.  10.  Neutropenia.  11.  Malnutrition.   NOTE:  The patient is advised to hold Coumadin until follow-up PT, INR check on Wednesday with Dr. Ma Hillock.   MEDICATIONS ON DISCHARGE: Amitriptyline 25 mg at bedtime, Zofran 4 mg every 6 hours as needed for nausea, vomiting, omeprazole 20 mg daily, Cymbalta 60 mg daily, prednisone 5 mg daily, Premarin 0.625 mg daily, levothyroxine 50 mcg at bedtime, folic acid 1 mg daily, methotrexate 2.5 mg 4 tablets once a week on Friday, milk of magnesia 30 mL at bedtime, Tandem iron 1 capsule twice a day, Duragesic patch 2 patches of the 100 mcg/h transdermal extended release film plus another 75 mcg per hour patch; all those patches to be changed every 72 hours. Requip 0.5 mg at bedtime, Ambien 10 mg at bedtime, tramadol 50 mg once a day as needed, promethazine 25 mg 3 times a day as needed, Dukes mouthwash 5 mL 4 times a day as needed, Tylenol Extra Strength 500 mg 2 tablets every 6 hours as needed for pain, Ensure 240 mL 3 times a day.   DIET: Regular diet, regular consistency.   ACTIVITY: As tolerated.   FOLLOWUP: Follow up with Dr. Ma Hillock in a couple days for an INR check.   HISTORY OF PRESENT ILLNESS:  The patient was admitted with abdominal pain, nausea, vomiting and brought in for a partial small bowel obstruction, treated for hypokalemia. Her Coumadin was held with a  supratherapeutic INR.   CONSULTANTS DURING THE HOSPITAL COURSE INCLUDED:  1.  Dr. Bary Castilla, surgery. 2.  Dr. Inez Pilgrim, oncology.  3.  Josh Borders, palliative care.  4.  Dr. Ma Hillock from oncology also saw over the weekend.   LABORATORY AND RADIOLOGICAL DATA: During the hospital course included: CA-125 of 666.9, INR 4.9, lipase of 35, glucose 121, BUN 24, creatinine 1.26, sodium 134, potassium 2.9, chloride 88, CO2 of 42, calcium 8.3. Liver function tests: Alkaline phosphatase 329, ALT 28, AST 24, albumin 2.5. White blood cell count 5.3, hemoglobin and hematocrit 9.2 and 27.7, platelet count of 235. Urinalysis showed 1+ bacteria but nitrite and leukocyte esterase were negative. White blood cell count on the 26th dropped down to 1.5, hemoglobin 7.4, flat and upright showed findings suggestive of generalized ileus on 06/26.  On the 27th, white count went down to 0.9, INR up at 4.5. On the 28th INR is 6.2, white blood cell count 1.3. Upon discharge June 30th, white blood cell count 4.1, hemoglobin 9.0, platelet count of 322, magnesium 2.1, potassium 4.2 and an INR of 4.1.   Ultrasound of the abdomen showed indeterminate mass along the anterior aspect of the right lobe of the liver. No sonographic evidence of cholecystitis.  EKG showed a normal sinus rhythm, left atrial enlargement.  CT scan of the abdomen and pelvis showed new left hydroureter and hydronephrosis secondary to a  soft tissue mass in the left pelvis, also a soft tissue density in the anterior right pelvic region secondary to underlying malignancy. Distended loops of small bowel concerning for a small bowel obstruction.   HOSPITAL COURSE PER PROBLEM LIST:  1.  For the patient's abdominal pain with nausea, vomiting, partial small bowel obstruction, the patient was treated conservatively with IV fluids, p.r.n. pain medications, p.r.n.  nausea medications, initially kept n.p.o., slowly advanced on the diet. The patient did have a bowel movement  starting 05/16/2013. The patient was advanced on the diet and was tolerating diet and tolerated 3 solid meals, dinner on 06/29, breakfast and lunch on 06/30. The patient was discharged home in the afternoon of 05/17/2013 in stable condition. Since the patient does have metastatic ovarian cancer, there is a high likelihood that this will happen again.  2.  For the patient's left hydroureter and hydronephrosis, the patient was also seen in consultation by Dr. Jacqlyn Larsen, and he recommended just monitoring at this time since the patient was not having any symptoms.   He did recommend that the patient may end up needing stent placement in the future but no urgent urological intervention is needed at this time.  3.  Hypokalemia: The patient's potassium was replaced IV during the hospitalization.  Since she is  back on a diet and tolerating it, I did not send her home on potassium supplementation.  4.  For her chronic pain, she is on high-dose Duragesic patch and tramadol and Tylenol.  5.  For her stage IV ovarian cancer, she will follow up with Dr. Oliva Bustard on 07/09 for consideration for further treatment. Overall prognosis is poor. The patient is a DO NOT RESUSCITATE.  6.  For a history of deep vein thrombosis with coagulopathy, her INR was elevated during the entire hospital course. I have a follow-up appointment with Dr. Ma Hillock at the [facility] on Wednesday for an INR check. She is to hold her Coumadin until then.  At home prior to coming into the hospital, she was taking 5 mg twice a week, a total of 10 mg in a week. Her dose may be closer to 1 mg 5 times a week. I did give her a script for this, but I told her to not take any Coumadin until seeing Dr. Ma Hillock.  7.  For depression, no changes in medications.  8.  For hypothyroidism, no changes in medication.  9.  For her restless leg syndrome, she is on Requip.  10.  For her neutropenia, she did receive Neupogen to elevate her counts, and her white count was in  the normal range upon discharge.  11.  For her malnutrition, likely secondary to her ovarian cancer and partial small bowel obstruction, she is prescribed Ensure upon discharge.   TIME SPENT ON DISCHARGE: 35 minutes.   ____________________________ Tana Conch. Leslye Peer, MD rjw:cb D: 05/17/2013 15:25:14 ET T: 05/17/2013 15:36:53 ET JOB#: [id number]  cc: Tana Conch. Leslye Peer, MD, <Dictator> Janak K. Choksi, MD Sandeep R. Ma Hillock, MD Marisue Brooklyn MD ELECTRONICALLY SIGNED 05/29/2013 11:05

## 2015-03-12 NOTE — Op Note (Signed)
PATIENT NAME:  Wanda Mann, Wanda Mann MR#:  [id number] DATE OF BIRTH:  28-Jun-1951  DATE OF PROCEDURE:  05/19/2012  PREOPERATIVE DIAGNOSIS: Extensive synovitis, left knee.   POSTOPERATIVE DIAGNOSIS: Extensive synovitis, left knee.   PROCEDURE PERFORMED: Major synovectomy, left knee, anterior, posterior, medial, lateral and suprapatellar regions.   SURGEON: Park Breed, MD   ANESTHESIA: General LMA.   COMPLICATIONS: None.   DRAINS: None.   OPERATIVE FINDINGS: The patient had extensive synovitis and scarring throughout the knee. The gutters were narrowed and the suprapatellar region was narrowed. The menisci were normal, and the ligaments were normal. The articular surfaces were normal. The patellofemoral joint was normal.   OPERATIVE PROCEDURE: The patient was brought to the Operating Room where she underwent satisfactory general LMA anesthesia in the supine position. The left knee was prepped and draped in sterile fashion and arthroscopy carried out through standard portals. The above findings were encountered. Extensive synovectomy was carried out in the anterior portion of the knee. The medial and lateral gutters were cleared out with using both motorized resectors and the ArthroCare wand. The suprapatellar region was debrided free of muscle adhesions. Once this had been done to the extent I felt was necessary, bleeders were cauterized. The knee was flushed. Instruments were removed and stab wounds closed with 3-0 nylon suture. In the joint, 0.5% Marcaine with epinephrine was placed. Dry sterile compression dressing was applied. The tourniquet was not used. The patient was awakened and taken to recovery in good condition. ____________________________ Park Breed, MD hem:cbb D: 05/19/2012 14:48:24 ET T: 05/19/2012 16:05:31 ET JOB#: [id number] Park Breed MD ELECTRONICALLY SIGNED 05/20/2012 18:40
